# Patient Record
Sex: Female | Born: 1965
Health system: Southern US, Community
[De-identification: ages and names within clinical notes are randomized; demographics above are authoritative.]

## PROBLEM LIST (undated history)

## (undated) DIAGNOSIS — J45909 Unspecified asthma, uncomplicated: Secondary | ICD-10-CM

## (undated) HISTORY — DX: Unspecified asthma, uncomplicated: J45.909

## (undated) HISTORY — PX: TUBAL LIGATION: SHX77

## (undated) HISTORY — PX: ABDOMINAL HYSTERECTOMY: SHX81

## (undated) HISTORY — PX: KNEE SURGERY: SHX244

---

## 2010-03-06 ENCOUNTER — Emergency Department (HOSPITAL_BASED_OUTPATIENT_CLINIC_OR_DEPARTMENT_OTHER): Admission: EM | Admit: 2010-03-06 | Discharge: 2010-03-06 | Payer: Self-pay | Admitting: Emergency Medicine

## 2010-03-10 ENCOUNTER — Emergency Department (HOSPITAL_BASED_OUTPATIENT_CLINIC_OR_DEPARTMENT_OTHER): Admission: EM | Admit: 2010-03-10 | Discharge: 2010-03-10 | Payer: Self-pay | Admitting: Emergency Medicine

## 2010-04-12 ENCOUNTER — Emergency Department (HOSPITAL_BASED_OUTPATIENT_CLINIC_OR_DEPARTMENT_OTHER)
Admission: EM | Admit: 2010-04-12 | Discharge: 2010-04-12 | Payer: Self-pay | Source: Home / Self Care | Admitting: Emergency Medicine

## 2010-07-17 LAB — WET PREP, GENITAL: Yeast Wet Prep HPF POC: NONE SEEN

## 2010-07-17 LAB — GC/CHLAMYDIA PROBE AMP, GENITAL: GC Probe Amp, Genital: NEGATIVE

## 2010-07-17 LAB — CULTURE, ROUTINE-ABSCESS

## 2010-11-04 ENCOUNTER — Emergency Department (HOSPITAL_BASED_OUTPATIENT_CLINIC_OR_DEPARTMENT_OTHER)
Admission: EM | Admit: 2010-11-04 | Discharge: 2010-11-04 | Disposition: A | Discharge status: Home / Self Care | Payer: Self-pay | Attending: Emergency Medicine | Admitting: Emergency Medicine

## 2010-11-04 ENCOUNTER — Emergency Department (INDEPENDENT_AMBULATORY_CARE_PROVIDER_SITE_OTHER): Payer: Self-pay

## 2010-11-04 DIAGNOSIS — R0789 Other chest pain: Secondary | ICD-10-CM | POA: Insufficient documentation

## 2010-11-04 DIAGNOSIS — R05 Cough: Secondary | ICD-10-CM | POA: Insufficient documentation

## 2010-11-04 DIAGNOSIS — R079 Chest pain, unspecified: Secondary | ICD-10-CM

## 2010-11-04 DIAGNOSIS — F17200 Nicotine dependence, unspecified, uncomplicated: Secondary | ICD-10-CM

## 2010-11-04 DIAGNOSIS — R0602 Shortness of breath: Secondary | ICD-10-CM

## 2010-11-04 DIAGNOSIS — R059 Cough, unspecified: Secondary | ICD-10-CM | POA: Insufficient documentation

## 2010-11-04 DIAGNOSIS — R071 Chest pain on breathing: Secondary | ICD-10-CM | POA: Insufficient documentation

## 2011-09-28 ENCOUNTER — Emergency Department (HOSPITAL_BASED_OUTPATIENT_CLINIC_OR_DEPARTMENT_OTHER)
Admission: EM | Admit: 2011-09-28 | Discharge: 2011-09-28 | Disposition: A | Discharge status: Home / Self Care | Payer: Self-pay | Attending: Emergency Medicine | Admitting: Emergency Medicine

## 2011-09-28 ENCOUNTER — Encounter (HOSPITAL_BASED_OUTPATIENT_CLINIC_OR_DEPARTMENT_OTHER): Payer: Self-pay | Admitting: *Deleted

## 2011-09-28 DIAGNOSIS — F17200 Nicotine dependence, unspecified, uncomplicated: Secondary | ICD-10-CM | POA: Insufficient documentation

## 2011-09-28 DIAGNOSIS — H00019 Hordeolum externum unspecified eye, unspecified eyelid: Secondary | ICD-10-CM | POA: Insufficient documentation

## 2011-09-28 MED ORDER — FLUORESCEIN SODIUM 1 MG OP STRP
ORAL_STRIP | OPHTHALMIC | Status: AC
  Filled 2011-09-28: qty 1

## 2011-09-28 MED ORDER — DOXYCYCLINE MONOHYDRATE 100 MG PO TABS: 100.0000 mg | ORAL_TABLET | Freq: Two times a day (BID) | ORAL | Status: AC

## 2011-09-28 MED ORDER — TETRACAINE HCL 0.5 % OP SOLN
OPHTHALMIC | Status: AC
  Filled 2011-09-28: qty 2

## 2011-09-28 MED ORDER — DOXYCYCLINE HYCLATE 100 MG PO CAPS: 100.0000 mg | ORAL_CAPSULE | Freq: Two times a day (BID) | ORAL | Status: AC

## 2011-09-28 NOTE — Discharge Instructions (Signed)
Sty  A sty (hordeolum) is an infection of a gland in the eyelid located at the base of the eyelash. A sty may develop a white or yellow head of pus. It can be puffy (swollen). Usually, the sty will burst and pus will come out on its own. They do not leave lumps in the eyelid once they drain.  A sty is often confused with another form of cyst of the eyelid called a chalazion. Chalazions occur within the eyelid and not on the edge where the bases of the eyelashes are. They often are red, sore and then form firm lumps in the eyelid.  CAUSES    Germs (bacteria).   Lasting (chronic) eyelid inflammation.  SYMPTOMS    Tenderness, redness and swelling along the edge of the eyelid at the base of the eyelashes.   Sometimes, there is a white or yellow head of pus. It may or may not drain.  DIAGNOSIS   An ophthalmologist will be able to distinguish between a sty and a chalazion and treat the condition appropriately.   TREATMENT    Styes are typically treated with warm packs (compresses) until drainage occurs.   In rare cases, medicines that kill germs (antibiotics) may be prescribed. These antibiotics may be in the form of drops, cream or pills.   If a hard lump has formed, it is generally necessary to do a small incision and remove the hardened contents of the cyst in a minor surgical procedure done in the office.   In suspicious cases, your caregiver may send the contents of the cyst to the lab to be certain that it is not a rare, but dangerous form of cancer of the glands of the eyelid.  HOME CARE INSTRUCTIONS    Wash your hands often and dry them with a clean towel. Avoid touching your eyelid. This may spread the infection to other parts of the eye.   Apply heat to your eyelid for 10 to 20 minutes, several times a day, to ease pain and help to heal it faster.   Do not squeeze the sty. Allow it to drain on its own. Wash your eyelid carefully 3 to 4 times per day to remove any pus.  SEEK IMMEDIATE MEDICAL CARE IF:     Your eye becomes painful or puffy (swollen).   Your vision changes.   Your sty does not drain by itself within 3 days.   Your sty comes back within a short period of time, even with treatment.   You have redness (inflammation) around the eye.   You have a fever.  Document Released: 01/30/2005 Document Revised: 04/11/2011 Document Reviewed: 10/04/2008  [employer] Patient Information 2012 [employer].

## 2011-09-28 NOTE — ED Notes (Signed)
Patient reports irritation around R eye, used warms compress and cleared up, two days ago, R eye started swelling again, used warm compress but swelling has grown worse, hurts to touch

## 2011-09-28 NOTE — ED Provider Notes (Signed)
History     CSN: [id number]  Arrival date & time 09/28/11  1151   First MD Initiated Contact with Patient 09/28/11 1235      Chief Complaint  Patient presents with  . Eye Pain    (Consider location/radiation/quality/duration/timing/severity/associated sxs/prior treatment) HPI  Patient reports that she had stye on right eye a week ago. She states that the symptoms improved but then worsened after using some eye makeup. She states that for 2 days she has had increased swelling and pain of the right upper eyelid. She denies any involvement of the eye itself. Her vision has been normal. She has had some discharge on her lids on awakening in the morning. She has no surrounding redness, fever, or chills. There has been no new trauma to the eye. She has no foreign body sensation to the eye.  History reviewed. No pertinent past medical history.  Past Surgical History  Procedure Date  . Knee surgery     bilateral  . Tubal ligation     No family history on file.  History  Substance Use Topics  . Smoking status: Current Everyday Smoker  . Smokeless tobacco: Not on file  . Alcohol Use: Yes    OB History    Grav Para Term Preterm Abortions TAB SAB Ect Mult Living                  Review of Systems  All other systems reviewed and are negative.    Allergies  Review of patient's allergies indicates no known allergies.  Home Medications  No current outpatient prescriptions on file.  BP 152/86  Pulse 61  Temp(Src) 97.9 F (36.6 C) (Oral)  Resp 16  Ht 5\' 4"  (1.626 m)  Wt 155 lb (70.308 kg)  BMI 26.61 kg/m2  SpO2 99%  Physical Exam  Nursing note and vitals reviewed. Constitutional: She is oriented to person, place, and time. She appears well-developed and well-nourished.  HENT:  Head: Normocephalic and atraumatic.  Right Ear: External ear normal.  Left Ear: External ear normal.  Nose: Nose normal.  Mouth/Throat: Oropharynx is clear and moist.  Eyes: Conjunctivae  and EOM are normal. Pupils are equal, round, and reactive to light. Right eye exhibits hordeolum. Right eye exhibits no chemosis, no discharge and no exudate. No foreign body present in the right eye. Left eye exhibits no chemosis, no discharge, no exudate and no hordeolum. No foreign body present in the left eye.         Upper lid with some surrounding erythema  Neck: Normal range of motion. Neck supple.  Neurological: She is alert and oriented to person, place, and time.  Skin: Skin is warm and dry.  Psychiatric: She has a normal mood and affect.    ED Course  Procedures (including critical care time)  Labs Reviewed - No data to display No results found.   No diagnosis found.    MDM  Patient with hordeolum right eyelid.  Plan warm compresses and given surrounding erythema will treat with oral antibiotics for staph coverage.  Patient advised regarding cleaning.       Hilario Quarry, MD 09/28/11 1248

## 2011-11-27 ENCOUNTER — Emergency Department (HOSPITAL_BASED_OUTPATIENT_CLINIC_OR_DEPARTMENT_OTHER)
Admission: EM | Admit: 2011-11-27 | Discharge: 2011-11-27 | Disposition: A | Discharge status: Home / Self Care | Payer: Self-pay | Attending: Emergency Medicine | Admitting: Emergency Medicine

## 2011-11-27 ENCOUNTER — Encounter (HOSPITAL_BASED_OUTPATIENT_CLINIC_OR_DEPARTMENT_OTHER): Payer: Self-pay | Admitting: *Deleted

## 2011-11-27 DIAGNOSIS — F17200 Nicotine dependence, unspecified, uncomplicated: Secondary | ICD-10-CM | POA: Insufficient documentation

## 2011-11-27 DIAGNOSIS — K047 Periapical abscess without sinus: Secondary | ICD-10-CM | POA: Insufficient documentation

## 2011-11-27 DIAGNOSIS — K029 Dental caries, unspecified: Secondary | ICD-10-CM | POA: Insufficient documentation

## 2011-11-27 MED ORDER — OXYCODONE-ACETAMINOPHEN 7.5-325 MG PO TABS: 1.0000 | ORAL_TABLET | ORAL | Status: AC | PRN

## 2011-11-27 MED ORDER — PENICILLIN V POTASSIUM 500 MG PO TABS: 500.0000 mg | ORAL_TABLET | Freq: Four times a day (QID) | ORAL | Status: AC

## 2011-11-27 MED ORDER — IBUPROFEN 600 MG PO TABS: 600.0000 mg | ORAL_TABLET | Freq: Four times a day (QID) | ORAL | Status: AC | PRN

## 2011-11-27 NOTE — ED Provider Notes (Signed)
History     CSN: [id number]  Arrival date & time 11/27/11  1758   First MD Initiated Contact with Patient 11/27/11 1809      Chief Complaint  Patient presents with  . Dental Pain     HPI Patient has known dental caries and has had swelling and pain in the left upper molars the last 4 days.  Patient denies fever.  Patient has no dentist because of lack of insurance. History reviewed. No pertinent past medical history.  Past Surgical History  Procedure Date  . Knee surgery     bilateral  . Tubal ligation     History reviewed. No pertinent family history.  History  Substance Use Topics  . Smoking status: Current Everyday Smoker  . Smokeless tobacco: Not on file  . Alcohol Use: Yes    OB History    Grav Para Term Preterm Abortions TAB SAB Ect Mult Living                  Review of Systems  All other systems reviewed and are negative.    Allergies  Review of patient's allergies indicates no known allergies.  Home Medications  No current outpatient prescriptions on file.  BP 130/70  Pulse 79  Temp 98.8 F (37.1 C)  Resp 16  Ht 5\' 3"  (1.6 m)  Wt 168 lb (76.204 kg)  BMI 29.76 kg/m2  SpO2 100%  LMP 11/20/2011  Physical Exam  Nursing note and vitals reviewed. Constitutional: She is oriented to person, place, and time. She appears well-developed. No distress.  HENT:  Head: Normocephalic and atraumatic.  Mouth/Throat:    Eyes: Pupils are equal, round, and reactive to light.  Neck: Normal range of motion.  Cardiovascular: Normal rate and intact distal pulses.   Pulmonary/Chest: No respiratory distress.  Abdominal: Normal appearance. She exhibits no distension.  Musculoskeletal: Normal range of motion.  Neurological: She is alert and oriented to person, place, and time. No cranial nerve deficit.  Skin: Skin is warm and dry. No rash noted.  Psychiatric: She has a normal mood and affect. Her behavior is normal.    ED Course  Procedures (including  critical care time)  Labs Reviewed - No data to display No results found.   1. Tooth abscess   2. Dental caries       MDM   I suspect a periapical abscess is forming.  Plan is to start on antibiotics and pain control patient Edwin Dada to followup with the dentist oral surgeon.      Nelia Shi, MD 11/27/11 1816

## 2011-11-27 NOTE — ED Notes (Signed)
Pt c/o toothache x 4 days.   

## 2012-09-09 ENCOUNTER — Encounter (HOSPITAL_BASED_OUTPATIENT_CLINIC_OR_DEPARTMENT_OTHER): Payer: Self-pay | Admitting: Family Medicine

## 2012-09-09 ENCOUNTER — Emergency Department (HOSPITAL_BASED_OUTPATIENT_CLINIC_OR_DEPARTMENT_OTHER)
Admission: EM | Admit: 2012-09-09 | Discharge: 2012-09-09 | Disposition: A | Discharge status: Home / Self Care | Payer: Self-pay | Attending: Emergency Medicine | Admitting: Emergency Medicine

## 2012-09-09 DIAGNOSIS — H109 Unspecified conjunctivitis: Secondary | ICD-10-CM | POA: Insufficient documentation

## 2012-09-09 DIAGNOSIS — Z79899 Other long term (current) drug therapy: Secondary | ICD-10-CM | POA: Insufficient documentation

## 2012-09-09 DIAGNOSIS — H5789 Other specified disorders of eye and adnexa: Secondary | ICD-10-CM | POA: Insufficient documentation

## 2012-09-09 DIAGNOSIS — F17200 Nicotine dependence, unspecified, uncomplicated: Secondary | ICD-10-CM | POA: Insufficient documentation

## 2012-09-09 MED ORDER — SULFAMETHOXAZOLE-TRIMETHOPRIM 800-160 MG PO TABS: 1.0000 | ORAL_TABLET | Freq: Two times a day (BID) | ORAL | Status: DC

## 2012-09-09 MED ORDER — POLYMYXIN B-TRIMETHOPRIM 10000-0.1 UNIT/ML-% OP SOLN: 1.0000 [drp] | OPHTHALMIC | Status: DC

## 2012-09-09 MED ORDER — FLUORESCEIN SODIUM 1 MG OP STRP
1.0000 | ORAL_STRIP | Freq: Once | OPHTHALMIC | Status: AC
  Administered 2012-09-09: 1 via OPHTHALMIC
  Filled 2012-09-09: qty 1

## 2012-09-09 NOTE — ED Provider Notes (Addendum)
History     CSN: [id number]  Arrival date & time 09/09/12  1041   First MD Initiated Contact with Patient 09/09/12 1100      Chief Complaint  Patient presents with  . Eye Problem    (Consider location/radiation/quality/duration/timing/severity/associated sxs/prior treatment) HPI Comments: Patient presents to the ER for evaluation of irritation and draining from the left eye. Patient reports that initially the area was itchy and swollen, approximately 2 days ago. This started after working outside in the yard. She does have a history of allergies. Last night the swelling got worse this morning when she woke up there was redness and there was some matting of the eye. She has not had any eye pain or vision change. She denies injury.  Patient is a 47 y.o. female presenting with eye problem.  Eye Problem Associated symptoms: discharge and itching   Associated symptoms: no photophobia     History reviewed. No pertinent past medical history.  Past Surgical History  Procedure Laterality Date  . Knee surgery      bilateral  . Tubal ligation      No family history on file.  History  Substance Use Topics  . Smoking status: Current Every Day Smoker  . Smokeless tobacco: Not on file  . Alcohol Use: Yes    OB History   Grav Para Term Preterm Abortions TAB SAB Ect Mult Living                  Review of Systems  Eyes: Positive for discharge and itching. Negative for photophobia, pain and visual disturbance.    Allergies  Review of patient's allergies indicates no known allergies.  Home Medications   Current Outpatient Rx  Name  Route  Sig  Dispense  Refill  . albuterol (PROVENTIL HFA;VENTOLIN HFA) 108 (90 BASE) MCG/ACT inhaler   Inhalation   Inhale 2 puffs into the lungs every 6 (six) hours as needed. For shortness of breath         . ibuprofen (ADVIL,MOTRIN) 200 MG tablet   Oral   Take 600 mg by mouth every 6 (six) hours as needed. For teeth pain           BP  135/76  Pulse 68  Temp(Src) 98.7 F (37.1 C) (Oral)  Resp 16  Ht 5\' 3"  (1.6 m)  Wt 160 lb (72.576 kg)  BMI 28.35 kg/m2  SpO2 97%  LMP 09/07/2012  Physical Exam  Eyes: Pupils are equal, round, and reactive to light. Left eye exhibits no chemosis, no discharge and no exudate. No foreign body present in the left eye. Left conjunctiva is injected. Left conjunctiva has no hemorrhage. Left eye exhibits normal extraocular motion and no nystagmus.  Slit lamp exam:      The left eye shows no fluorescein uptake.  Edema and slight erythema of the left upper lid without any palpable nodularities or pus collections    ED Course  Procedures (including critical care time)  Labs Reviewed - No data to display No results found.   Diagnosis: Conjunctivitis, possible early chalazion    MDM  This presents to the ER with complaints of swelling and drainage from the left eye. There's conjunctivae injection. No foreign bodies are seen. Fluorescein exam does not show any ulceration, dendrites or abrasion. She does have mild erythema and swelling of the upper lip of any discrete lesion. She does report that she has had a previous infection of the eyelid, however. Patient will be treated  with Polytrim drops, Bactrim. She can take over-the-counter Benadryl as needed for swelling and itching.        Gilda Crease, MD 09/09/12 1144  Gilda Crease, MD 09/09/12 1144

## 2012-09-09 NOTE — ED Notes (Signed)
Pt c/o left eye draining and eyelid irritated and swollen x 2 days. Pt reports h/o same.

## 2012-10-01 ENCOUNTER — Encounter (HOSPITAL_BASED_OUTPATIENT_CLINIC_OR_DEPARTMENT_OTHER): Payer: Self-pay

## 2012-10-01 ENCOUNTER — Emergency Department (HOSPITAL_BASED_OUTPATIENT_CLINIC_OR_DEPARTMENT_OTHER)
Admission: EM | Admit: 2012-10-01 | Discharge: 2012-10-01 | Disposition: A | Discharge status: Home / Self Care | Payer: Self-pay | Attending: Emergency Medicine | Admitting: Emergency Medicine

## 2012-10-01 ENCOUNTER — Emergency Department (HOSPITAL_BASED_OUTPATIENT_CLINIC_OR_DEPARTMENT_OTHER): Payer: Self-pay

## 2012-10-01 DIAGNOSIS — J069 Acute upper respiratory infection, unspecified: Secondary | ICD-10-CM | POA: Insufficient documentation

## 2012-10-01 DIAGNOSIS — R062 Wheezing: Secondary | ICD-10-CM | POA: Insufficient documentation

## 2012-10-01 DIAGNOSIS — R059 Cough, unspecified: Secondary | ICD-10-CM | POA: Insufficient documentation

## 2012-10-01 DIAGNOSIS — IMO0001 Reserved for inherently not codable concepts without codable children: Secondary | ICD-10-CM | POA: Insufficient documentation

## 2012-10-01 DIAGNOSIS — Z87891 Personal history of nicotine dependence: Secondary | ICD-10-CM | POA: Insufficient documentation

## 2012-10-01 DIAGNOSIS — J3489 Other specified disorders of nose and nasal sinuses: Secondary | ICD-10-CM | POA: Insufficient documentation

## 2012-10-01 DIAGNOSIS — R05 Cough: Secondary | ICD-10-CM | POA: Insufficient documentation

## 2012-10-01 MED ORDER — HYDROCOD POLST-CHLORPHEN POLST 10-8 MG/5ML PO LQCR: 5.0000 mL | Freq: Two times a day (BID) | ORAL | Status: DC | PRN

## 2012-10-01 NOTE — ED Provider Notes (Signed)
History     CSN: [id number]  Arrival date & time 10/01/12  1403   First MD Initiated Contact with Patient 10/01/12 1413      Chief Complaint  Patient presents with  . URI    (Consider location/radiation/quality/duration/timing/severity/associated sxs/prior treatment) Patient is a 47 y.o. female presenting with URI. The history is provided by the patient. No language interpreter was used.  URI Presenting symptoms: congestion and cough   Severity:  Moderate Onset quality:  Gradual Duration:  2 weeks Timing:  Intermittent Progression:  Waxing and waning Chronicity:  New Relieved by:  Nothing Ineffective treatments:  OTC medications Associated symptoms: myalgias and wheezing     History reviewed. No pertinent past medical history.  Past Surgical History  Procedure Laterality Date  . Knee surgery      bilateral  . Tubal ligation      No family history on file.  History  Substance Use Topics  . Smoking status: Former Smoker    Quit date: 09/12/2012  . Smokeless tobacco: Not on file  . Alcohol Use: Yes    OB History   Grav Para Term Preterm Abortions TAB SAB Ect Mult Living                  Review of Systems  HENT: Positive for congestion.   Respiratory: Positive for cough and wheezing.   Musculoskeletal: Positive for myalgias.  All other systems reviewed and are negative.    Allergies  Review of patient's allergies indicates no known allergies.  Home Medications   Current Outpatient Rx  Name  Route  Sig  Dispense  Refill  . albuterol (PROVENTIL HFA;VENTOLIN HFA) 108 (90 BASE) MCG/ACT inhaler   Inhalation   Inhale 2 puffs into the lungs every 6 (six) hours as needed. For shortness of breath         . ibuprofen (ADVIL,MOTRIN) 200 MG tablet   Oral   Take 600 mg by mouth every 6 (six) hours as needed. For teeth pain         . sulfamethoxazole-trimethoprim (SEPTRA DS) 800-160 MG per tablet   Oral   Take 1 tablet by mouth every 12 (twelve)  hours.   10 tablet   0   . trimethoprim-polymyxin b (POLYTRIM) ophthalmic solution   Left Eye   Place 1 drop into the left eye every 4 (four) hours.   10 mL   0     BP 117/74  Pulse 66  Temp(Src) 98.8 F (37.1 C) (Oral)  Resp 20  Ht 5\' 3"  (1.6 m)  Wt 158 lb (71.668 kg)  BMI 28 kg/m2  SpO2 98%  LMP 09/07/2012  Physical Exam  Nursing note and vitals reviewed. Constitutional: She is oriented to person, place, and time. She appears well-developed and well-nourished.  HENT:  Head: Normocephalic.  Mouth/Throat: No oropharyngeal exudate.  Pharyngeal erythema  Neck: Normal range of motion.  Cardiovascular: Normal rate and regular rhythm.   Pulmonary/Chest: Effort normal.  Bilateral rhonchi  Abdominal: Soft. Bowel sounds are normal.  Musculoskeletal: Normal range of motion. She exhibits no edema and no tenderness.  Lymphadenopathy:    She has no cervical adenopathy.  Neurological: She is alert and oriented to person, place, and time.  Skin: Skin is warm and dry. No rash noted.  Psychiatric: She has a normal mood and affect. Her behavior is normal. Judgment and thought content normal.    ED Course  Procedures (including critical care time)  Labs Reviewed - No data  to display No results found.   No diagnosis found.  No acute findings noted on chest films.  Results shared with patient.  Viral URI with cough.  MDM          Jimmye Norman, NP 10/01/12 1623

## 2012-10-01 NOTE — ED Notes (Signed)
C/o head and chest congestion x 2 weeks

## 2012-10-05 NOTE — ED Provider Notes (Signed)
Medical screening examination/treatment/procedure(s) were performed by non-physician practitioner and as supervising physician I was immediately available for consultation/collaboration.  Kelcy Baeten, MD 10/05/12 1420 

## 2012-11-10 ENCOUNTER — Encounter (HOSPITAL_BASED_OUTPATIENT_CLINIC_OR_DEPARTMENT_OTHER): Payer: Self-pay | Admitting: *Deleted

## 2012-11-10 ENCOUNTER — Emergency Department (HOSPITAL_BASED_OUTPATIENT_CLINIC_OR_DEPARTMENT_OTHER): Payer: Self-pay

## 2012-11-10 ENCOUNTER — Emergency Department (HOSPITAL_BASED_OUTPATIENT_CLINIC_OR_DEPARTMENT_OTHER)
Admission: EM | Admit: 2012-11-10 | Discharge: 2012-11-10 | Disposition: A | Discharge status: Other Acute Inpatient Hospital | Payer: Self-pay | Attending: Emergency Medicine | Admitting: Emergency Medicine

## 2012-11-10 DIAGNOSIS — Z79899 Other long term (current) drug therapy: Secondary | ICD-10-CM | POA: Insufficient documentation

## 2012-11-10 DIAGNOSIS — Y929 Unspecified place or not applicable: Secondary | ICD-10-CM | POA: Insufficient documentation

## 2012-11-10 DIAGNOSIS — S61209A Unspecified open wound of unspecified finger without damage to nail, initial encounter: Secondary | ICD-10-CM | POA: Insufficient documentation

## 2012-11-10 DIAGNOSIS — L089 Local infection of the skin and subcutaneous tissue, unspecified: Secondary | ICD-10-CM

## 2012-11-10 DIAGNOSIS — Z87891 Personal history of nicotine dependence: Secondary | ICD-10-CM | POA: Insufficient documentation

## 2012-11-10 DIAGNOSIS — W19XXXA Unspecified fall, initial encounter: Secondary | ICD-10-CM | POA: Insufficient documentation

## 2012-11-10 DIAGNOSIS — Y9302 Activity, running: Secondary | ICD-10-CM | POA: Insufficient documentation

## 2012-11-10 LAB — CBC WITH DIFFERENTIAL/PLATELET
Basophils Relative: 0 % (ref 0–1)
HCT: 35.5 % — ABNORMAL LOW (ref 36.0–46.0)
Hemoglobin: 12 g/dL (ref 12.0–15.0)
Lymphs Abs: 2.5 10*3/uL (ref 0.7–4.0)
MCHC: 33.8 g/dL (ref 30.0–36.0)
Monocytes Absolute: 1.1 10*3/uL — ABNORMAL HIGH (ref 0.1–1.0)
Monocytes Relative: 7 % (ref 3–12)
Neutro Abs: 11.5 10*3/uL — ABNORMAL HIGH (ref 1.7–7.7)
RBC: 3.6 MIL/uL — ABNORMAL LOW (ref 3.87–5.11)

## 2012-11-10 LAB — BASIC METABOLIC PANEL
BUN: 9 mg/dL (ref 6–23)
CO2: 27 mEq/L (ref 19–32)
Chloride: 101 mEq/L (ref 96–112)
Creatinine, Ser: 0.7 mg/dL (ref 0.50–1.10)
GFR calc Af Amer: >90 mL/min (ref >90)
Glucose, Bld: 102 mg/dL — ABNORMAL HIGH (ref 70–99)
Potassium: 3.7 mEq/L (ref 3.5–5.1)

## 2012-11-10 MED ORDER — SODIUM CHLORIDE 0.9 % IV SOLN
INTRAVENOUS | Status: DC
  Administered 2012-11-10: 12:00:00 via INTRAVENOUS

## 2012-11-10 MED ORDER — VANCOMYCIN HCL IN DEXTROSE 1-5 GM/200ML-% IV SOLN
1000.0000 mg | Freq: Once | INTRAVENOUS | Status: DC
  Filled 2012-11-10: qty 200

## 2012-11-10 MED ORDER — MORPHINE SULFATE 4 MG/ML IJ SOLN
4.0000 mg | Freq: Once | INTRAMUSCULAR | Status: AC
  Administered 2012-11-10: 4 mg via INTRAVENOUS
  Filled 2012-11-10: qty 1

## 2012-11-10 NOTE — ED Notes (Signed)
Dr. Rubin Payor wants Pt. To keep the IV in place with saline lock.

## 2012-11-10 NOTE — ED Provider Notes (Signed)
History    CSN: [id number] Arrival date & time 11/10/12  1112  First MD Initiated Contact with Patient 11/10/12 1134     Chief Complaint  Patient presents with  . Hand Injury   (Consider location/radiation/quality/duration/timing/severity/associated sxs/prior Treatment) Patient is a 47 y.o. female presenting with hand injury. The history is provided by the patient.  Hand Injury Associated symptoms: no back pain and no fever    patient fell on July 4 and injured her left hand. She states she had had pain along the little finger with some swelling up until yesterday she states now it is much more swollen although it. There is pain with moving of the fingers. No fevers. No numbness weakness. She states she has other abrasions but no real pain. She states that she was bumped in the face also has a black eye. She states she has no headache. No difficulty seeing. She states there's been some drainage from the finger. History reviewed. No pertinent past medical history. Past Surgical History  Procedure Laterality Date  . Knee surgery      bilateral  . Tubal ligation     No family history on file. History  Substance Use Topics  . Smoking status: Former Smoker    Quit date: 09/12/2012  . Smokeless tobacco: Not on file  . Alcohol Use: Yes   OB History   Grav Para Term Preterm Abortions TAB SAB Ect Mult Living                 Review of Systems  Constitutional: Negative for fever, activity change and appetite change.  HENT: Negative for sore throat and neck stiffness.   Eyes: Negative for pain.  Respiratory: Negative for chest tightness and shortness of breath.   Cardiovascular: Negative for chest pain and leg swelling.  Gastrointestinal: Negative for nausea, vomiting, abdominal pain and diarrhea.  Genitourinary: Negative for flank pain.  Musculoskeletal: Positive for joint swelling. Negative for back pain.  Skin: Positive for color change and wound. Negative for rash.   Neurological: Negative for weakness, numbness and headaches.  Psychiatric/Behavioral: Negative for behavioral problems.    Allergies  Review of patient's allergies indicates no known allergies.  Home Medications   Current Outpatient Rx  Name  Route  Sig  Dispense  Refill  . albuterol (PROVENTIL HFA;VENTOLIN HFA) 108 (90 BASE) MCG/ACT inhaler   Inhalation   Inhale 2 puffs into the lungs every 6 (six) hours as needed. For shortness of breath         . chlorpheniramine-HYDROcodone (TUSSIONEX PENNKINETIC ER) 10-8 MG/5ML LQCR   Oral   Take 5 mLs by mouth every 12 (twelve) hours as needed.   140 mL   0   . ibuprofen (ADVIL,MOTRIN) 200 MG tablet   Oral   Take 600 mg by mouth every 6 (six) hours as needed. For teeth pain         . sulfamethoxazole-trimethoprim (SEPTRA DS) 800-160 MG per tablet   Oral   Take 1 tablet by mouth every 12 (twelve) hours.   10 tablet   0   . trimethoprim-polymyxin b (POLYTRIM) ophthalmic solution   Left Eye   Place 1 drop into the left eye every 4 (four) hours.   10 mL   0    BP 140/87  Pulse 70  Temp(Src) 98.2 F (36.8 C) (Oral)  Resp 20  Ht 5\' 3"  (1.6 m)  Wt 158 lb (71.668 kg)  BMI 28 kg/m2  SpO2 100%  LMP  11/06/2012 Physical Exam  Nursing note and vitals reviewed. Constitutional: She is oriented to person, place, and time. She appears well-developed and well-nourished.  HENT:  Head: Normocephalic and atraumatic.  Eyes: EOM are normal. Pupils are equal, round, and reactive to light.  Right periorbital ecchymosis. Extraocular movements intact. Minimal tenderness. No proptosis  Neck: Normal range of motion. Neck supple.  Cardiovascular: Normal rate, regular rhythm and normal heart sounds.   No murmur heard. Pulmonary/Chest: Effort normal and breath sounds normal. No respiratory distress. She has no wheezes. She has no rales.  Abdominal: Soft. Bowel sounds are normal. She exhibits no distension. There is no tenderness. There is no  rebound and no guarding.  Musculoskeletal: She exhibits tenderness.  Swelling of left hand diffusely. Erythema over fourth and fifth fingers at MCP joint area. Pain with movement of the little finger and ring finger. Decreased range of motion due to the swelling and pain. There is scab laterally on the proximal phalanx of the fifth finger that has purulent drainage.  Neurological: She is alert and oriented to person, place, and time. No cranial nerve deficit.  Skin: Skin is warm and dry.  Psychiatric: She has a normal mood and affect. Her speech is normal.    ED Course  Procedures (including critical care time) Labs Reviewed  CBC WITH DIFFERENTIAL - Abnormal; Notable for the following:    WBC 15.7 (*)    RBC 3.60 (*)    HCT 35.5 (*)    Neutro Abs 11.5 (*)    Monocytes Absolute 1.1 (*)    All other components within normal limits  BASIC METABOLIC PANEL - Abnormal; Notable for the following:    Glucose, Bld 102 (*)    All other components within normal limits  WOUND CULTURE   Dg Hand Complete Left  11/10/2012   *RADIOLOGY REPORT*  Clinical Data: Fall with pain and swelling.  LEFT HAND - COMPLETE 3+ VIEW  Comparison: None.  Findings: Extensive soft tissue swelling at the level of the dorsal and lateral wrist and hand, extending into the fourth and fifth digits.  No definitive fracture.  There is borderline widening of the scaphoid lunate interval, not definitively pathologic. Incidental degenerative changes at the fifth DIP.  IMPRESSION: Extensive soft tissue swelling to the dorsal and lateral wrist/hand.  No acute fracture or malalignment detected.   Original Report Authenticated By: Tiburcio Pea   1. Finger infection     MDM  Patient with fall 4 days ago. Later complicated with redness and increased pain and swelling. X-ray does not show fracture. White count is elevated and there is draining purulence. Patient cannot flex or extend the finger. May require drainage. Discussed with Dr.  Wyline Mood who has accepted the patient transfer and will see them in the [facility] ER   Juliet Rude. Rubin Payor, MD 11/10/12 1350

## 2012-11-10 NOTE — ED Notes (Signed)
Pt. Aware of plan of care 

## 2012-11-10 NOTE — ED Notes (Signed)
Running and fell 4 days ago. Injury to her left hand. Swelling noted. Bruise under her right eye.

## 2012-11-13 LAB — WOUND CULTURE

## 2015-03-03 IMAGING — CR DG CHEST 2V
2 series · 2 of 2 positions shown · non-contrast
Comparison: 11/04/2010

CLINICAL DATA: Cough.  Chest congestion.  Former smoker.

CHEST - 2 VIEW

[w chest pa]
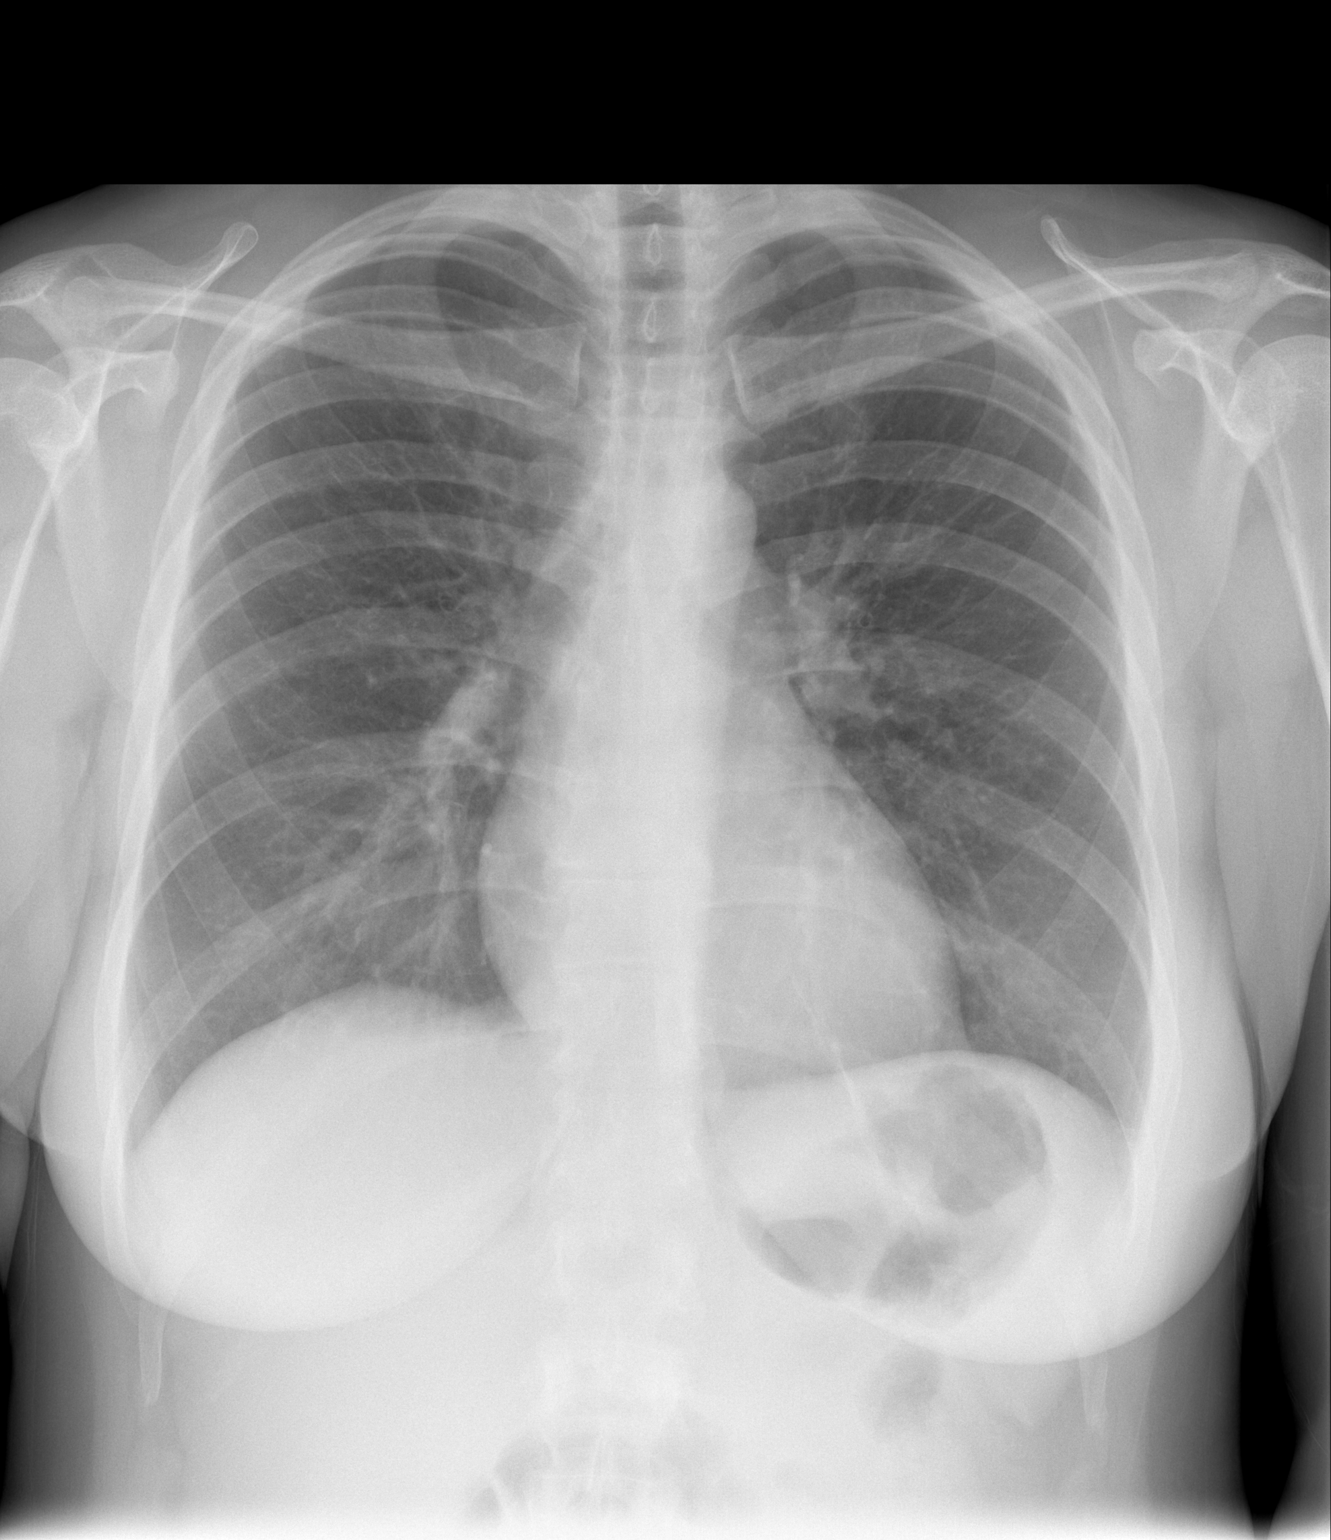

[w chest lat]
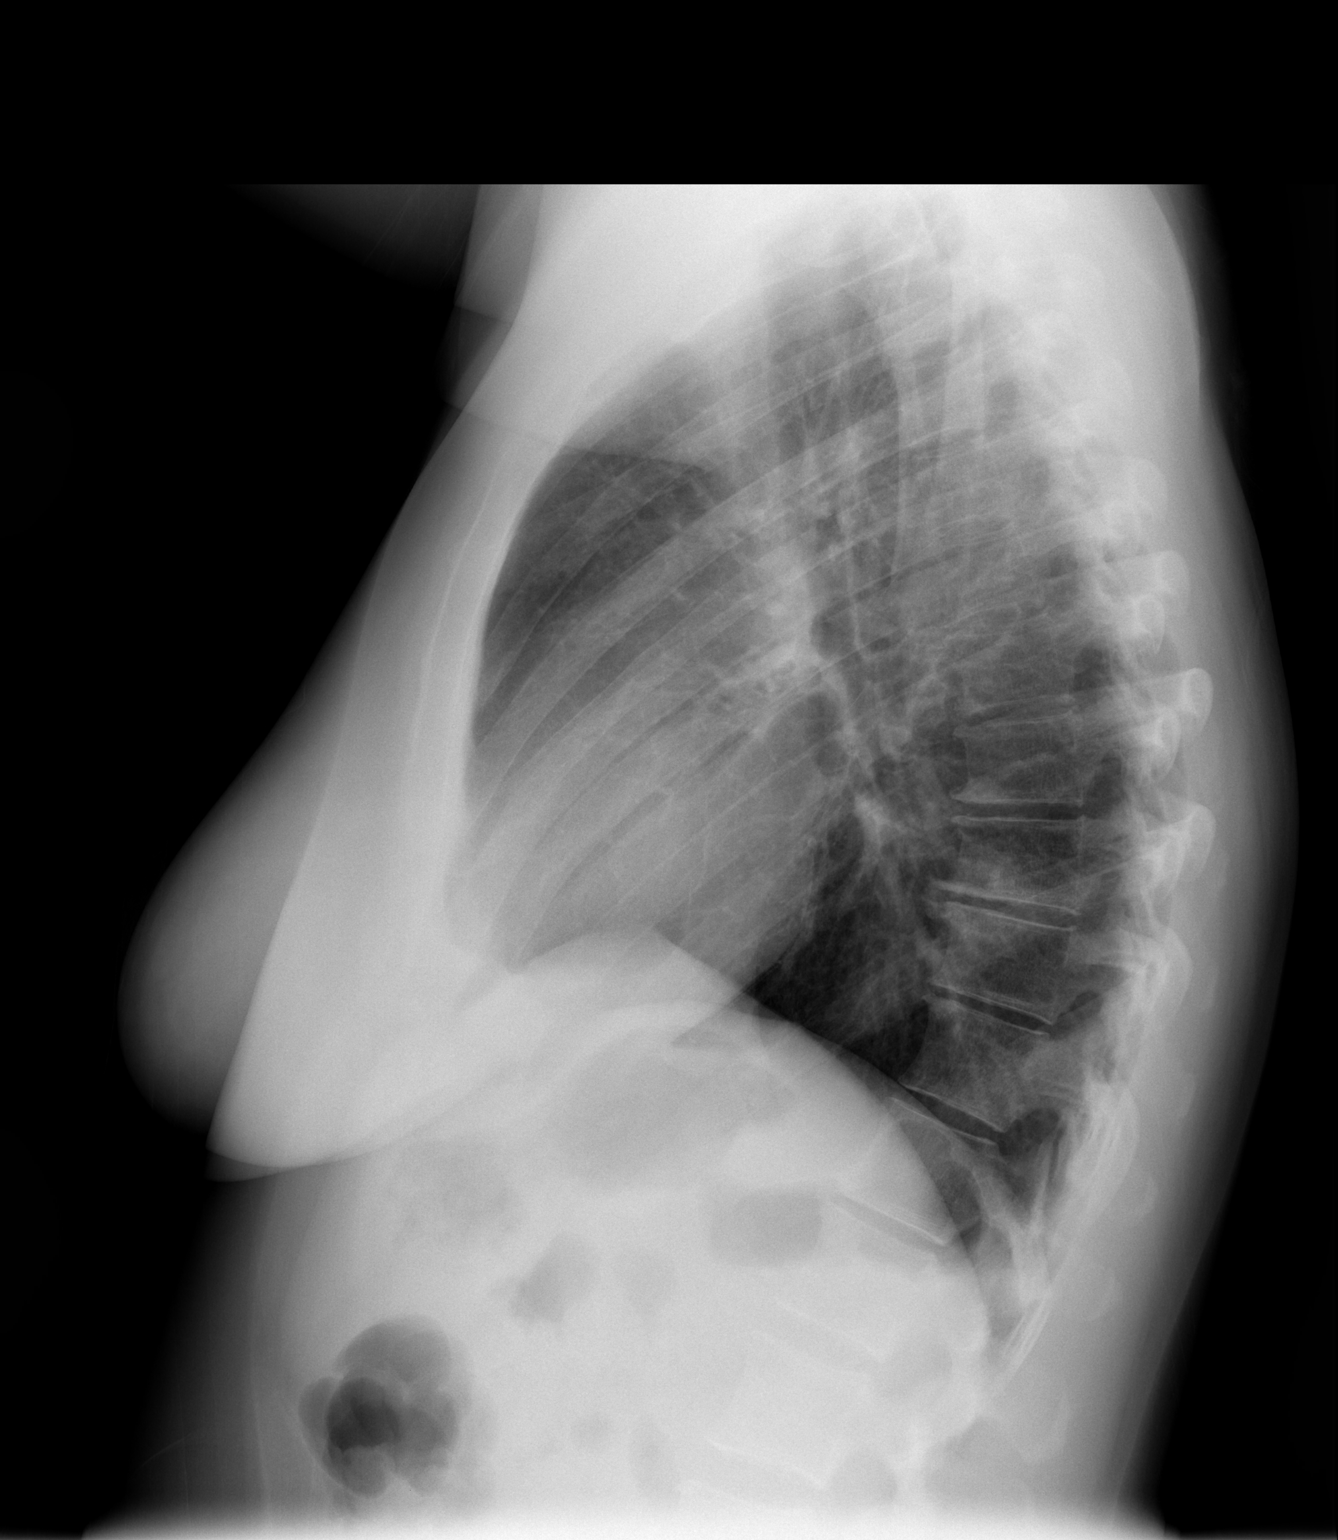

[2 of 2 positions shown; findings below may reference images not displayed]

FINDINGS: The heart size and mediastinal contours are within
normal limits.  Both lungs are clear.  The visualized skeletal
structures are unremarkable.
IMPRESSION: No active cardiopulmonary disease.

## 2015-10-18 ENCOUNTER — Ambulatory Visit (INDEPENDENT_AMBULATORY_CARE_PROVIDER_SITE_OTHER): Payer: PRIVATE HEALTH INSURANCE | Admitting: Allergy and Immunology

## 2015-10-18 ENCOUNTER — Encounter: Payer: Self-pay | Admitting: Allergy and Immunology

## 2015-10-18 VITALS — BP 130/84 | HR 64 | Temp 98.4°F | Resp 20 | Ht 63.2 in | Wt 155.6 lb

## 2015-10-18 DIAGNOSIS — J3089 Other allergic rhinitis: Secondary | ICD-10-CM | POA: Diagnosis not present

## 2015-10-18 DIAGNOSIS — J4540 Moderate persistent asthma, uncomplicated: Secondary | ICD-10-CM | POA: Insufficient documentation

## 2015-10-18 DIAGNOSIS — R05 Cough: Secondary | ICD-10-CM

## 2015-10-18 DIAGNOSIS — J45901 Unspecified asthma with (acute) exacerbation: Secondary | ICD-10-CM

## 2015-10-18 DIAGNOSIS — R059 Cough, unspecified: Secondary | ICD-10-CM

## 2015-10-18 MED ORDER — MONTELUKAST SODIUM 10 MG PO TABS: 10.0000 mg | ORAL_TABLET | Freq: Every day | ORAL | Status: DC

## 2015-10-18 MED ORDER — ALBUTEROL SULFATE HFA 108 (90 BASE) MCG/ACT IN AERS: 2.0000 | INHALATION_SPRAY | Freq: Four times a day (QID) | RESPIRATORY_TRACT | Status: DC | PRN

## 2015-10-18 MED ORDER — MOMETASONE FURO-FORMOTEROL FUM 200-5 MCG/ACT IN AERO: 2.0000 | INHALATION_SPRAY | Freq: Two times a day (BID) | RESPIRATORY_TRACT | Status: DC

## 2015-10-18 MED ORDER — PREDNISONE 1 MG PO TABS: 10.0000 mg | ORAL_TABLET | ORAL | Status: DC

## 2015-10-18 NOTE — Progress Notes (Signed)
New Patient Note  RE: Amanda Villanueva MRN: [id number] DOB: 12-25-1965 Date of Office Visit: 10/18/2015  Referring provider: No ref. provider found Primary care provider: No PCP Per Patient  Chief Complaint: Asthma; Wheezing; Cough; and Nasal Congestion   History of present illness: HPI Comments: Amanda Villanueva is a 50 y.o. female presenting today for evaluation of asthma and rhinosinusitis.  She reports that in early May 2017 she developed nasal symptoms which progressed into a sinus infection.  She believes that the sinus infection progressed into pneumonia stating that her upper respiratory tract infections "always go straight to my chest." Chest x-ray confirmed left lower lobe pneumonia. She was treated with antibiotics and her symptoms improved somewhat however she is still experiencing frequent coughing, chest tightness, and wheezing.  She has recently required albuterol multiple times per day and experiences nocturnal awakenings due to lower rest or symptoms several times per week.  She reports that she had a diagnosis of childhood asthma which resolved in her late 91s.  The asthma symptoms returned last year.  She experiences nasal congestion, postnasal drainage, and sinus pressure.  These symptoms seem to be triggered by dust and mold exposure.  She currently lives in a basement with dust and mold and her place of work is dusty as well.  Has a 5-pack-year history having quit tobacco in 2014.   Assessment and plan: Asthma with acute exacerbation  A two-view chest x-ray has been ordered for comparison with May 22 films.  Prednisone has been provided, 20 mg x 4 days, 10 mg x1 day, then stop.    A sample and prescription have been provided for [facility] (mometasone/formoterol) 200/5 g, 2 inhalations twice a day.  To maximize pulmonary deposition, a spacer has been provided along with instructions for its proper administration with an HFA inhaler. A prescription has been provided for  montelukast 10 mg daily at bedtime.  Continue albuterol every HFA, 1-2 inhalations every 4-6 hours as needed.  The patient has been asked to contact me if her symptoms persist or progress. Otherwise, she may return for follow up in 6 weeks.  Seasonal and perennial allergic rhinitis  Aeroallergen avoidance measures have been discussed and provided in written form.  A prescription has been provided for fluticasone nasal spray, 2 sprays per nostril daily as needed. Proper nasal spray technique has been discussed and demonstrated.  I have also recommended nasal saline spray (i.e., Simply Saline) or nasal saline lavage (i.e., NeilMed) as needed prior to medicated nasal sprays.  A prescription has been provided for montelukast (as above).  If allergen avoidance measures and medications fail to adequately relieve symptoms, aeroallergen immunotherapy will be considered.    Meds ordered this encounter  Medications  . mometasone-formoterol (DULERA) 200-5 MCG/ACT AERO    Sig: Inhale 2 puffs into the lungs 2 (two) times daily. USE SPACER.    Dispense:  1 Inhaler    Refill:  5  . montelukast (SINGULAIR) 10 MG tablet    Sig: Take 1 tablet (10 mg total) by mouth at bedtime.    Dispense:  30 tablet    Refill:  5  . albuterol (PROVENTIL HFA;VENTOLIN HFA) 108 (90 Base) MCG/ACT inhaler    Sig: Inhale 2 puffs into the lungs every 6 (six) hours as needed (USE SPACER). For shortness of breath    Dispense:  8 g    Refill:  2  . predniSONE (DELTASONE) tablet 10 mg    Sig:     Diagnositics: Spirometry:  FVC was 3.13 L and FEV1 was 2.77 L with 160 mL (6%) postbronchodilator improvement. Allergy skin testing: Positive to grass pollens and molds.    Physical examination: Blood pressure 130/84, pulse 64, temperature 98.4 F (36.9 C), temperature source Oral, resp. rate 20, height 5' 3.2" (1.605 m), weight 155 lb 9.6 oz (70.58 kg).  General: Alert, interactive, in no acute distress. HEENT: TMs  pearly gray, turbinates edematous without discharge, post-pharynx moderately erythematous. Neck: Supple without lymphadenopathy. Lungs: Clear to auscultation without wheezing, rhonchi or rales. CV: Normal S1, S2 without murmurs. Abdomen: Nondistended, nontender. Skin: Warm and dry, without lesions or rashes. Extremities:  No clubbing, cyanosis or edema. Neuro:   Grossly intact.  Review of systems:  Review of Systems  Constitutional: Negative for fever, chills and weight loss.  HENT: Positive for congestion. Negative for nosebleeds.   Eyes: Negative for blurred vision.  Respiratory: Positive for cough, sputum production, shortness of breath and wheezing. Negative for hemoptysis.   Cardiovascular: Negative for chest pain.  Gastrointestinal: Negative for diarrhea and constipation.  Genitourinary: Negative for dysuria.  Musculoskeletal: Negative for myalgias and joint pain.  Skin: Negative for itching and rash.  Neurological: Positive for headaches. Negative for dizziness.  Endo/Heme/Allergies: Positive for environmental allergies. Does not bruise/bleed easily.    Past medical history:  Past Medical History  Diagnosis Date  . Asthma     Past surgical history:  Past Surgical History  Procedure Laterality Date  . Knee surgery      bilateral  . Tubal ligation      Family history: Family History  Problem Relation Age of Onset  . Stroke Mother   . Allergic rhinitis Father   . Pneumonia Daughter     Social history: Social History   Social History  . Marital Status: Single    Spouse Name: N/A  . Number of Children: N/A  . Years of Education: N/A   Occupational History  . Not on file.   Social History Main Topics  . Smoking status: Former Smoker    Quit date: 09/12/2012  . Smokeless tobacco: Never Used  . Alcohol Use: Yes  . Drug Use: Yes    Special: Marijuana  . Sexual Activity: Yes    Birth Control/ Protection: Surgical   Other Topics Concern  . Not on file     Social History Narrative   Environmental History: Patient lives in a 49 year old house with hardwood floors throughout, gas heat, and window air conditioning units.  She has no pets in the home and is a former cigarette smoker having quit in May 2014.    Medication List       This list is accurate as of: 10/18/15  6:14 PM.  Always use your most recent med list.               albuterol 108 (90 Base) MCG/ACT inhaler  Commonly known as:  PROVENTIL HFA;VENTOLIN HFA  Inhale 2 puffs into the lungs every 6 (six) hours as needed (USE SPACER). For shortness of breath     ibuprofen 200 MG tablet  Commonly known as:  ADVIL,MOTRIN  Take 600 mg by mouth every 6 (six) hours as needed. For teeth pain     mometasone-formoterol 200-5 MCG/ACT Aero  Commonly known as:  DULERA  Inhale 2 puffs into the lungs 2 (two) times daily. USE SPACER.     montelukast 10 MG tablet  Commonly known as:  SINGULAIR  Take 1 tablet (10 mg total) by mouth  at bedtime.        Known medication allergies: No Known Allergies  I appreciate the opportunity to take part in this Amanda Villanueva's care. Please do not hesitate to contact me with questions.  Sincerely,   R. Edgar Frisk, MD

## 2015-10-18 NOTE — Patient Instructions (Addendum)
Asthma with acute exacerbation  A two-view chest x-ray has been ordered for comparison with May 22 films.  Prednisone has been provided, 20 mg x 4 days, 10 mg x1 day, then stop.    A sample and prescription have been provided for [facility] (mometasone/formoterol) 200/5 g, 2 inhalations twice a day.  To maximize pulmonary deposition, a spacer has been provided along with instructions for its proper administration with an HFA inhaler. A prescription has been provided for montelukast 10 mg daily at bedtime.  Continue albuterol every HFA, 1-2 inhalations every 4-6 hours as needed.  The patient has been asked to contact me if her symptoms persist or progress. Otherwise, she may return for follow up in 6 weeks.  Seasonal and perennial allergic rhinitis  Aeroallergen avoidance measures have been discussed and provided in written form.  A prescription has been provided for fluticasone nasal spray, 2 sprays per nostril daily as needed. Proper nasal spray technique has been discussed and demonstrated.  I have also recommended nasal saline spray (i.e., Simply Saline) or nasal saline lavage (i.e., NeilMed) as needed prior to medicated nasal sprays.  A prescription has been provided for montelukast (as above).  If allergen avoidance measures and medications fail to adequately relieve symptoms, aeroallergen immunotherapy will be considered.    Return in about 6 weeks (around 11/29/2015), or if symptoms worsen or fail to improve.  Reducing Pollen Exposure  The American Academy of Allergy, Asthma and Immunology suggests the following steps to reduce your exposure to pollen during allergy seasons.    1. Do not hang sheets or clothing out to dry; pollen may collect on these items. 2. Do not mow lawns or spend time around freshly cut grass; mowing stirs up pollen. 3. Keep windows closed at night.  Keep car windows closed while driving. 4. Minimize morning activities outdoors, a time when pollen  counts are usually at their highest. 5. Stay indoors as much as possible when pollen counts or humidity is high and on windy days when pollen tends to remain in the air longer. 6. Use air conditioning when possible.  Many air conditioners have filters that trap the pollen spores. 7. Use a HEPA room air filter to remove pollen form the indoor air you breathe.   Control of Mold Allergen  Mold and fungi can grow on a variety of surfaces provided certain temperature and moisture conditions exist.  Outdoor molds grow on plants, decaying vegetation and soil.  The major outdoor mold, Alternaria and Cladosporium, are found in very high numbers during hot and dry conditions.  Generally, a late Summer - Fall peak is seen for common outdoor fungal spores.  Rain will temporarily lower outdoor mold spore count, but counts rise rapidly when the rainy period ends.  The most important indoor molds are Aspergillus and Penicillium.  Dark, humid and poorly ventilated basements are ideal sites for mold growth.  The next most common sites of mold growth are the bathroom and the kitchen.  Outdoor [employer] 2. Use air conditioning and keep windows closed 3. Avoid exposure to decaying vegetation. 4. Avoid leaf raking. 5. Avoid grain handling. 6. Consider wearing a face mask if working in moldy areas.  Indoor Mold Control 1. Maintain humidity below 50%. 2. Clean washable surfaces with 5% bleach solution. 3. Remove sources e.g. Contaminated carpets.

## 2015-10-18 NOTE — Assessment & Plan Note (Addendum)
   A two-view chest x-ray has been ordered for comparison with May 22 films.  Prednisone has been provided, 20 mg x 4 days, 10 mg x1 day, then stop.    A sample and prescription have been provided for [facility] (mometasone/formoterol) 200/5 g, 2 inhalations twice a day.  To maximize pulmonary deposition, a spacer has been provided along with instructions for its proper administration with an HFA inhaler. A prescription has been provided for montelukast 10 mg daily at bedtime.  Continue albuterol every HFA, 1-2 inhalations every 4-6 hours as needed.  The patient has been asked to contact me if her symptoms persist or progress. Otherwise, she may return for follow up in 6 weeks.

## 2015-10-18 NOTE — Assessment & Plan Note (Addendum)
   Aeroallergen avoidance measures have been discussed and provided in written form.  A prescription has been provided for fluticasone nasal spray, 2 sprays per nostril daily as needed. Proper nasal spray technique has been discussed and demonstrated.  I have also recommended nasal saline spray (i.e., Simply Saline) or nasal saline lavage (i.e., NeilMed) as needed prior to medicated nasal sprays.  A prescription has been provided for montelukast (as above).  If allergen avoidance measures and medications fail to adequately relieve symptoms, aeroallergen immunotherapy will be considered.

## 2015-10-19 ENCOUNTER — Encounter: Payer: Self-pay | Admitting: Allergy and Immunology

## 2015-10-23 ENCOUNTER — Telehealth: Payer: Self-pay

## 2015-10-23 NOTE — Telephone Encounter (Signed)
SPOKE WITH PATIENT ON 10/19/15.  GAVE INFO ON CHEST XRAY PER DR BARDELAS. PT HAD PERFORMED AT [place].  TOLD PATIENT CXR NORMAL PER DR BARDELAS. FORWARDED RESULTS TO DR BOBBITT.

## 2015-10-23 NOTE — Telephone Encounter (Signed)
I was not able to pull up the results/interpretation.

## 2015-11-30 ENCOUNTER — Encounter: Payer: Self-pay | Admitting: Allergy and Immunology

## 2015-11-30 ENCOUNTER — Ambulatory Visit (INDEPENDENT_AMBULATORY_CARE_PROVIDER_SITE_OTHER): Payer: PRIVATE HEALTH INSURANCE | Admitting: Allergy and Immunology

## 2015-11-30 ENCOUNTER — Encounter (INDEPENDENT_AMBULATORY_CARE_PROVIDER_SITE_OTHER): Payer: Self-pay

## 2015-11-30 VITALS — BP 124/60 | HR 78 | Temp 98.8°F

## 2015-11-30 DIAGNOSIS — H1045 Other chronic allergic conjunctivitis: Secondary | ICD-10-CM | POA: Diagnosis not present

## 2015-11-30 DIAGNOSIS — J4540 Moderate persistent asthma, uncomplicated: Secondary | ICD-10-CM

## 2015-11-30 DIAGNOSIS — J3089 Other allergic rhinitis: Secondary | ICD-10-CM | POA: Diagnosis not present

## 2015-11-30 DIAGNOSIS — H1010 Acute atopic conjunctivitis, unspecified eye: Secondary | ICD-10-CM | POA: Insufficient documentation

## 2015-11-30 MED ORDER — MOMETASONE FURO-FORMOTEROL FUM 100-5 MCG/ACT IN AERO: 2.0000 | INHALATION_SPRAY | Freq: Two times a day (BID) | RESPIRATORY_TRACT | 5 refills | Status: DC

## 2015-11-30 MED ORDER — OLOPATADINE HCL 0.7 % OP SOLN: 1.0000 [drp] | Freq: Every day | OPHTHALMIC | 5 refills | Status: DC | PRN

## 2015-11-30 NOTE — Assessment & Plan Note (Signed)
Well-controlled, we will stepdown therapy at this time.  A sample and prescription have been provided for [facility] 100/5 g, 2 inhalations via spacer device twice a day.   If lower respiratory symptoms progress in frequency and/or severity, the patient is to resume the previous dose.  For now, continue montelukast 10 mg daily at bedtime and albuterol HFA, 1-2 inhalations every 4-6 hours as needed.  Subjective and objective measures of pulmonary function will be followed and the treatment plan will be adjusted accordingly.

## 2015-11-30 NOTE — Patient Instructions (Addendum)
Moderate persistent asthma Well-controlled, we will stepdown therapy at this time.  A sample and prescription have been provided for [facility] 100/5 g, 2 inhalations via spacer device twice a day.   If lower respiratory symptoms progress in frequency and/or severity, the patient is to resume the previous dose.  For now, continue montelukast 10 mg daily at bedtime and albuterol HFA, 1-2 inhalations every 4-6 hours as needed.  Subjective and objective measures of pulmonary function will be followed and the treatment plan will be adjusted accordingly.  Seasonal and perennial allergic rhinitis  For now, continue appropriate allergen avoidance measures and montelukast 10 mg daily bedtime.  For thick postnasal drainage, guaifenesin 1200 mg (Mucinex maximum strength) twice daily as needed with adequate hydration as discussed.   Nasal saline lavage (NeilMed) as needed has been recommended along with instructions for proper administration.  Seasonal allergic conjunctivitis  Treatment plan as outlined above for allergic rhinitis.  A prescription has been provided for Pazeo, one drop per eye daily as needed.   Return in about 4 months (around 04/01/2016), or if symptoms worsen or fail to improve.

## 2015-11-30 NOTE — Assessment & Plan Note (Addendum)
   For now, continue appropriate allergen avoidance measures and montelukast 10 mg daily bedtime.  For thick postnasal drainage, guaifenesin 1200 mg (Mucinex maximum strength) twice daily as needed with adequate hydration as discussed.   Nasal saline lavage (NeilMed) as needed has been recommended along with instructions for proper administration.

## 2015-11-30 NOTE — Progress Notes (Signed)
Follow-up Note  RE: Amanda Villanueva MRN: [id number] DOB: October 24, 1965 Date of Office Visit: 11/30/2015  Primary care provider: No PCP Per Patient Referring provider: No ref. provider found  History of present illness: Amanda Villanueva is a 50 y.o. female with persistent asthma and allergic nidus presenting today for follow up.  She was last seen in this clinic on 10/18/2015.  She reports that in the interval since her previous visit her asthma has been well-controlled with Dulera 200/5 g, 2 inhalations via spacer device twice a day, and montelukast 10 mg daily at bedtime.  Recently, her lower respiratory symptoms have only been triggered occasionally by hot/humid weather.  She complains of ocular pruritus when around freshman grass.  She has no nasal symptom complaints today with the exception of occasional thick postnasal drainage leading to raspy voice.    Assessment and plan: Moderate persistent asthma Well-controlled, we will stepdown therapy at this time.  A sample and prescription have been provided for [facility] 100/5 g, 2 inhalations via spacer device twice a day.   If lower respiratory symptoms progress in frequency and/or severity, the patient is to resume the previous dose.  For now, continue montelukast 10 mg daily at bedtime and albuterol HFA, 1-2 inhalations every 4-6 hours as needed.  Subjective and objective measures of pulmonary function will be followed and the treatment plan will be adjusted accordingly.  Seasonal and perennial allergic rhinitis  For now, continue appropriate allergen avoidance measures and montelukast 10 mg daily bedtime.  For thick postnasal drainage, guaifenesin 1200 mg (Mucinex maximum strength) twice daily as needed with adequate hydration as discussed.   Nasal saline lavage (NeilMed) as needed has been recommended along with instructions for proper administration.  Seasonal allergic conjunctivitis  Treatment plan as outlined above for allergic  rhinitis.  A prescription has been provided for Pazeo, one drop per eye daily as needed.   Meds ordered this encounter  Medications  . mometasone-formoterol (DULERA) 100-5 MCG/ACT AERO    Sig: Inhale 2 puffs into the lungs 2 (two) times daily.    Dispense:  1 Inhaler    Refill:  5    PLACE ON HOLD, PATIENT WILL CALL.  Marland Kitchen Olopatadine HCl (PAZEO) 0.7 % SOLN    Sig: Place 1 drop into both eyes daily as needed.    Dispense:  1 Bottle    Refill:  5    Diagnositics: Spirometry:  Normal with an FEV1 of 108% predicted.  Please see scanned spirometry results for details.    Physical examination: Blood pressure 124/60, pulse 78, temperature 98.8 F (37.1 C), temperature source Oral.  General: Alert, interactive, in no acute distress. HEENT: TMs pearly gray, turbinates mildly edematous without discharge, post-pharynx mildly erythematous. Neck: Supple without lymphadenopathy. Lungs: Clear to auscultation without wheezing, rhonchi or rales. CV: Normal S1, S2 without murmurs. Skin: Warm and dry, without lesions or rashes.  The following portions of the patient's history were reviewed and updated as appropriate: allergies, current medications, past family history, past medical history, past social history, past surgical history and problem list.    Medication List       Accurate as of 11/30/15  1:29 PM. Always use your most recent med list.          albuterol 108 (90 Base) MCG/ACT inhaler Commonly known as:  PROVENTIL HFA;VENTOLIN HFA Inhale 2 puffs into the lungs every 6 (six) hours as needed (USE SPACER). For shortness of breath   ibuprofen 200 MG tablet Commonly known  as:  ADVIL,MOTRIN Take 600 mg by mouth every 6 (six) hours as needed. For teeth pain   mometasone-formoterol 200-5 MCG/ACT Aero Commonly known as:  DULERA Inhale 2 puffs into the lungs 2 (two) times daily. USE SPACER.   mometasone-formoterol 100-5 MCG/ACT Aero Commonly known as:  DULERA Inhale 2 puffs into  the lungs 2 (two) times daily.   montelukast 10 MG tablet Commonly known as:  SINGULAIR Take 1 tablet (10 mg total) by mouth at bedtime.   Olopatadine HCl 0.7 % Soln Commonly known as:  PAZEO Place 1 drop into both eyes daily as needed.       No Known Allergies  I appreciate the opportunity to take part in [place] care. Please do not hesitate to contact me with questions.  Sincerely,   R. Edgar Frisk, MD

## 2015-11-30 NOTE — Assessment & Plan Note (Signed)
   Treatment plan as outlined above for allergic rhinitis.  A prescription has been provided for Pazeo, one drop per eye daily as needed. 

## 2016-04-04 ENCOUNTER — Ambulatory Visit: Payer: PRIVATE HEALTH INSURANCE | Admitting: Allergy and Immunology

## 2017-05-26 ENCOUNTER — Emergency Department (HOSPITAL_BASED_OUTPATIENT_CLINIC_OR_DEPARTMENT_OTHER)
Admission: EM | Admit: 2017-05-26 | Discharge: 2017-05-26 | Disposition: A | Discharge status: Home / Self Care | Payer: Self-pay | Attending: Emergency Medicine | Admitting: Emergency Medicine

## 2017-05-26 ENCOUNTER — Encounter (HOSPITAL_BASED_OUTPATIENT_CLINIC_OR_DEPARTMENT_OTHER): Payer: Self-pay | Admitting: *Deleted

## 2017-05-26 ENCOUNTER — Other Ambulatory Visit: Payer: Self-pay

## 2017-05-26 DIAGNOSIS — F1210 Cannabis abuse, uncomplicated: Secondary | ICD-10-CM | POA: Insufficient documentation

## 2017-05-26 DIAGNOSIS — Y929 Unspecified place or not applicable: Secondary | ICD-10-CM | POA: Insufficient documentation

## 2017-05-26 DIAGNOSIS — R252 Cramp and spasm: Secondary | ICD-10-CM | POA: Insufficient documentation

## 2017-05-26 DIAGNOSIS — Z79899 Other long term (current) drug therapy: Secondary | ICD-10-CM | POA: Insufficient documentation

## 2017-05-26 DIAGNOSIS — Z87891 Personal history of nicotine dependence: Secondary | ICD-10-CM | POA: Insufficient documentation

## 2017-05-26 DIAGNOSIS — Y9389 Activity, other specified: Secondary | ICD-10-CM | POA: Insufficient documentation

## 2017-05-26 DIAGNOSIS — S29012A Strain of muscle and tendon of back wall of thorax, initial encounter: Secondary | ICD-10-CM | POA: Insufficient documentation

## 2017-05-26 DIAGNOSIS — J45909 Unspecified asthma, uncomplicated: Secondary | ICD-10-CM | POA: Insufficient documentation

## 2017-05-26 DIAGNOSIS — Y998 Other external cause status: Secondary | ICD-10-CM | POA: Insufficient documentation

## 2017-05-26 DIAGNOSIS — X58XXXA Exposure to other specified factors, initial encounter: Secondary | ICD-10-CM | POA: Insufficient documentation

## 2017-05-26 MED ORDER — TRAMADOL HCL 50 MG PO TABS: 50.0000 mg | ORAL_TABLET | Freq: Four times a day (QID) | ORAL | 0 refills | Status: DC | PRN

## 2017-05-26 MED ORDER — MELOXICAM 15 MG PO TABS: 15.0000 mg | ORAL_TABLET | Freq: Every day | ORAL | 0 refills | Status: DC

## 2017-05-26 MED ORDER — BACLOFEN 10 MG PO TABS: 10.0000 mg | ORAL_TABLET | Freq: Three times a day (TID) | ORAL | 0 refills | Status: DC

## 2017-05-26 MED FILL — BACLOFEN 10 MG TABS: 10 | 10 days supply | Qty: 30 | Fill #0

## 2017-05-26 MED FILL — MELOXICAM 15 MG TABLET: 15 | 30 days supply | Qty: 30 | Fill #0

## 2017-05-26 NOTE — Discharge Instructions (Signed)
SEEK IMMEDIATE MEDICAL ATTENTION IF: New numbness, tingling, weakness, or problem with the use of your arms or legs.  Severe back pain not relieved with medications.  Change in bowel or bladder control.  Increasing pain in any areas of the body (such as chest or abdominal pain).  Shortness of breath, dizziness or fainting.  Nausea (feeling sick to your stomach), vomiting, fever, or sweats.  

## 2017-05-26 NOTE — ED Provider Notes (Signed)
[place] EMERGENCY DEPARTMENT Provider Note   CSN: [id number] Arrival date & time: 05/26/17  1339     History   Chief Complaint Chief Complaint  Patient presents with  . Back Pain    HPI Amanda Villanueva is a 52 y.o. female who presents the emergency department with chief complaint of mid back pain.  Patient states that she was leaning over yesterday when she had sudden onset of acute back spasm in her mid back.  She denies any radiation down her legs or involvement of her upper extremities.  Pain is improved when she sits still, worse with any kind of movement and twisting.  She states that she was in tears today at work because it hurts for her to sit upright at her desk.  She took ibuprofen with some improvement in her symptoms.  She states that she is never had this happen before.  She denies difficulty breathing, cough, pleuritic chest pain.  HPI  Past Medical History:  Diagnosis Date  . Asthma     Patient Active Problem List   Diagnosis Date Noted  . Seasonal allergic conjunctivitis 11/30/2015  . Moderate persistent asthma 10/18/2015  . Seasonal and perennial allergic rhinitis 10/18/2015    Past Surgical History:  Procedure Laterality Date  . KNEE SURGERY     bilateral  . TUBAL LIGATION      OB History    No data available       Home Medications    Prior to Admission medications   Medication Sig Start Date End Date Taking? Authorizing Provider  albuterol (PROVENTIL HFA;VENTOLIN HFA) 108 (90 Base) MCG/ACT inhaler Inhale 2 puffs into the lungs every 6 (six) hours as needed (USE SPACER). For shortness of breath 10/18/15   Bobbitt, Sedalia Muta, MD  baclofen (LIORESAL) 10 MG tablet Take 1 tablet (10 mg total) by mouth 3 (three) times daily. 05/26/17   Margarita Mail, PA-C  ibuprofen (ADVIL,MOTRIN) 200 MG tablet Take 600 mg by mouth every 6 (six) hours as needed. For teeth pain    [provider]  meloxicam (MOBIC) 15 MG tablet Take 1 tablet  (15 mg total) by mouth daily. 05/26/17   Jailine Lieder, PA-C  mometasone-formoterol (DULERA) 200-5 MCG/ACT AERO Inhale 2 puffs into the lungs 2 (two) times daily. USE SPACER. 10/18/15   Bobbitt, Sedalia Muta, MD  traMADol (ULTRAM) 50 MG tablet Take 1 tablet (50 mg total) by mouth every 6 (six) hours as needed. 05/26/17   Margarita Mail, PA-C    Family History Family History  Problem Relation Age of Onset  . Stroke Mother   . Allergic rhinitis Father   . Pneumonia Daughter     Social History Social History   Tobacco Use  . Smoking status: Former Smoker    Last attempt to quit: 09/12/2012    Years since quitting: 4.7  . Smokeless tobacco: Never Used  Substance Use Topics  . Alcohol use: Yes  . Drug use: Yes    Types: Marijuana     Allergies   Patient has no known allergies.   Review of Systems Review of Systems Ten systems reviewed and are negative for acute change, except as noted in the HPI.    Physical Exam Updated Vital Signs BP 125/71 (BP Location: Left Arm)   Pulse 65   Temp 98.7 F (37.1 C) (Oral)   Resp 18   Ht 5\' 2"  (1.575 m)   Wt 72.6 kg (160 lb)   LMP 04/29/2017  SpO2 99%   BMI 29.26 kg/m   Physical Exam  Constitutional: She is oriented to person, place, and time. She appears well-developed and well-nourished. No distress.  HENT:  Head: Normocephalic and atraumatic.  Eyes: Conjunctivae are normal. No scleral icterus.  Neck: Normal range of motion.  Cardiovascular: Normal rate, regular rhythm and normal heart sounds. Exam reveals no gallop and no friction rub.  No murmur heard. Pulmonary/Chest: Effort normal and breath sounds normal. No respiratory distress.  Abdominal: Soft. Bowel sounds are normal. She exhibits no distension and no mass. There is no tenderness. There is no rebound and no guarding.  Musculoskeletal:  Tender to palpation in the bilateral lower trapezius, upper medial latissimus region, worse on the right, pain with twisting,  sitting upright, forward flexion.  Neurological: She is alert and oriented to person, place, and time.  Skin: Skin is warm and dry. She is not diaphoretic.  Psychiatric: Her behavior is normal.  Nursing note and vitals reviewed.    ED Treatments / Results  Labs (all labs ordered are listed, but only abnormal results are displayed) Labs Reviewed - No data to display  EKG  EKG Interpretation None       Radiology No results found.  Procedures Procedures (including critical care time)  Medications Ordered in ED Medications - No data to display   Initial Impression / Assessment and Plan / ED Course  I have reviewed the triage vital signs and the nursing notes.  Pertinent labs & imaging results that were available during my care of the patient were reviewed by me and considered in my medical decision making (see chart for details).     Patient with back pain.  No neurological deficits and normal neuro exam.  Patient can walk but states is painful.  No loss of bowel or bladder control.  No concern for cauda equina.  No fever, night sweats, weight loss, h/o cancer, IVDU.  RICE protocol and pain medicine indicated and discussed with patient.    Final Clinical Impressions(s) / ED Diagnoses   Final diagnoses:  Strain of mid-back, initial encounter    ED Discharge Orders        Ordered    meloxicam (MOBIC) 15 MG tablet  Daily     05/26/17 1552    baclofen (LIORESAL) 10 MG tablet  3 times daily     05/26/17 1552    traMADol (ULTRAM) 50 MG tablet  Every 6 hours PRN     05/26/17 1552       Margarita Mail, PA-C 05/26/17 1702    Fredia Sorrow, MD 05/28/17 [phone]

## 2017-05-26 NOTE — ED Triage Notes (Signed)
Pt c/o lower back pain x 6 days, increased pain with movt

## 2018-01-17 ENCOUNTER — Emergency Department (HOSPITAL_BASED_OUTPATIENT_CLINIC_OR_DEPARTMENT_OTHER)
Admission: EM | Admit: 2018-01-17 | Discharge: 2018-01-17 | Disposition: A | Discharge status: Home / Self Care | Payer: No Typology Code available for payment source | Attending: Emergency Medicine | Admitting: Emergency Medicine

## 2018-01-17 ENCOUNTER — Other Ambulatory Visit: Payer: Self-pay

## 2018-01-17 ENCOUNTER — Encounter (HOSPITAL_BASED_OUTPATIENT_CLINIC_OR_DEPARTMENT_OTHER): Payer: Self-pay | Admitting: Emergency Medicine

## 2018-01-17 DIAGNOSIS — J45909 Unspecified asthma, uncomplicated: Secondary | ICD-10-CM | POA: Diagnosis not present

## 2018-01-17 DIAGNOSIS — M545 Low back pain, unspecified: Secondary | ICD-10-CM

## 2018-01-17 DIAGNOSIS — Z79899 Other long term (current) drug therapy: Secondary | ICD-10-CM | POA: Insufficient documentation

## 2018-01-17 DIAGNOSIS — Z87891 Personal history of nicotine dependence: Secondary | ICD-10-CM | POA: Diagnosis not present

## 2018-01-17 MED ORDER — TRAMADOL HCL 50 MG PO TABS: 50.0000 mg | ORAL_TABLET | Freq: Four times a day (QID) | ORAL | 0 refills | Status: DC | PRN

## 2018-01-17 MED ORDER — MELOXICAM 15 MG PO TABS: 15.0000 mg | ORAL_TABLET | Freq: Every day | ORAL | 1 refills | Status: DC

## 2018-01-17 MED ORDER — BACLOFEN 10 MG PO TABS: 10.0000 mg | ORAL_TABLET | Freq: Three times a day (TID) | ORAL | 0 refills | Status: DC

## 2018-01-17 MED ORDER — HYDROMORPHONE HCL 1 MG/ML IJ SOLN
2.0000 mg | Freq: Once | INTRAMUSCULAR | Status: AC
  Administered 2018-01-17: 2 mg via INTRAMUSCULAR
  Filled 2018-01-17: qty 2

## 2018-01-17 NOTE — ED Notes (Signed)
Pt understood dc material. NAD Noted. Scripts and doctors note given at [employer]

## 2018-01-17 NOTE — ED Triage Notes (Signed)
Pt c/o lower back pain that has progressively worsened this week.

## 2018-01-17 NOTE — Discharge Instructions (Signed)
Take the medications as directed.  Take the meloxicam and/or Mobic on a regular basis take the baclofen on a regular basis.  Motrin can be substituted for the Mobic.  Tramadol to substitute for additional pain relief.  Work note provided.  If not improving over the next several days follow-up.

## 2018-01-17 NOTE — ED Provider Notes (Signed)
[place] EMERGENCY DEPARTMENT Provider Note   CSN: [id number] Arrival date & time: 01/17/18  0636     History   Chief Complaint Chief Complaint  Patient presents with  . Back Pain    HPI Amanda Villanueva is a 52 y.o. female.  Patient presents with exacerbation of low back pain.  On both sides does radiate into the upper part of the thighs.  No radiation of pain into the calves no numbness or weakness to the legs.  No history of injury.  Patient was seen in January with similar complaints.  Has been taking the medication she was prescribed by her physician assistant at that time on and off with fair amount of relief.     Past Medical History:  Diagnosis Date  . Asthma     Patient Active Problem List   Diagnosis Date Noted  . Seasonal allergic conjunctivitis 11/30/2015  . Moderate persistent asthma 10/18/2015  . Seasonal and perennial allergic rhinitis 10/18/2015    Past Surgical History:  Procedure Laterality Date  . KNEE SURGERY     bilateral  . TUBAL LIGATION       OB History   None      Home Medications    Prior to Admission medications   Medication Sig Start Date End Date Taking? Authorizing Provider  albuterol (PROVENTIL HFA;VENTOLIN HFA) 108 (90 Base) MCG/ACT inhaler Inhale 2 puffs into the lungs every 6 (six) hours as needed (USE SPACER). For shortness of breath 10/18/15  Yes Bobbitt, Sedalia Muta, MD  mometasone-formoterol [facility]) 200-5 MCG/ACT AERO Inhale 2 puffs into the lungs 2 (two) times daily. USE SPACER. 10/18/15  Yes Bobbitt, Sedalia Muta, MD  baclofen (LIORESAL) 10 MG tablet Take 1 tablet (10 mg total) by mouth 3 (three) times daily. 01/17/18   Fredia Sorrow, MD  meloxicam (MOBIC) 15 MG tablet Take 1 tablet (15 mg total) by mouth daily. 01/17/18   Fredia Sorrow, MD  traMADol (ULTRAM) 50 MG tablet Take 1 tablet (50 mg total) by mouth every 6 (six) hours as needed. 01/17/18   Fredia Sorrow, MD    Family History Family History    Problem Relation Age of Onset  . Stroke Mother   . Allergic rhinitis Father   . Pneumonia Daughter     Social History Social History   Tobacco Use  . Smoking status: Former Smoker    Last attempt to quit: 09/12/2012    Years since quitting: 5.3  . Smokeless tobacco: Never Used  Substance Use Topics  . Alcohol use: Yes  . Drug use: Yes    Types: Marijuana     Allergies   Patient has no known allergies.   Review of Systems Review of Systems  Constitutional: Negative for fever.  HENT: Negative for congestion.   Respiratory: Negative for shortness of breath.   Cardiovascular: Negative for chest pain.  Gastrointestinal: Negative for abdominal pain.  Genitourinary: Negative for difficulty urinating.  Musculoskeletal: Positive for back pain.  Skin: Negative for rash.  Neurological: Negative for weakness and numbness.  Hematological: Does not bruise/bleed easily.  Psychiatric/Behavioral: Negative for confusion.     Physical Exam Updated Vital Signs BP (!) 134/113   Pulse 81   Temp 98.1 F (36.7 C) (Oral)   Resp 20   Ht 1.575 m (5\' 2" )   Wt 72.6 kg   SpO2 98%   BMI 29.26 kg/m   Physical Exam  Constitutional: She is oriented to person, place, and time. She appears well-developed and  well-nourished. No distress.  HENT:  Head: Normocephalic and atraumatic.  Eyes: Pupils are equal, round, and reactive to light. EOM are normal.  Neck: Neck supple.  Cardiovascular: Normal rate, regular rhythm and normal heart sounds.  Pulmonary/Chest: Effort normal and breath sounds normal. No respiratory distress.  Abdominal: Soft. Bowel sounds are normal. There is no tenderness.  Musculoskeletal: Normal range of motion. She exhibits no edema.  Dorsalis pedis pulse one plus both feet.  Good cap refill.  Sensation intact.  Good muscle strength to the foot toes.  Bilaterally.  Neurological: She is alert and oriented to person, place, and time. No cranial nerve deficit or sensory  deficit. She exhibits normal muscle tone. Coordination normal.  Skin: Skin is warm. Capillary refill takes less than 2 seconds. No rash noted.  Nursing note and vitals reviewed.    ED Treatments / Results  Labs (all labs ordered are listed, but only abnormal results are displayed) Labs Reviewed - No data to display  EKG None  Radiology No results found.  Procedures Procedures (including critical care time)  Medications Ordered in ED Medications  HYDROmorphone (DILAUDID) injection 2 mg (2 mg Intramuscular Given 01/17/18 0726)     Initial Impression / Assessment and Plan / ED Course  I have reviewed the triage vital signs and the nursing notes.  Pertinent labs & imaging results that were available during my care of the patient were reviewed by me and considered in my medical decision making (see chart for details).     Patient with an exacerbation of low back pain with a history of low back pain.  No signs of sciatica or cauda equina.  Patient was improvement with hydromorphone IM.  Will re-prescribe the medications that helped her and back in January.  Work note provided.  Final Clinical Impressions(s) / ED Diagnoses   Final diagnoses:  Acute bilateral low back pain without sciatica    ED Discharge Orders         Ordered    meloxicam (MOBIC) 15 MG tablet  Daily     01/17/18 0811    baclofen (LIORESAL) 10 MG tablet  3 times daily     01/17/18 0811    traMADol (ULTRAM) 50 MG tablet  Every 6 hours PRN     01/17/18 0811           Fredia Sorrow, MD 01/17/18 [phone]

## 2018-05-11 ENCOUNTER — Emergency Department (HOSPITAL_BASED_OUTPATIENT_CLINIC_OR_DEPARTMENT_OTHER): Payer: No Typology Code available for payment source

## 2018-05-11 ENCOUNTER — Emergency Department (HOSPITAL_BASED_OUTPATIENT_CLINIC_OR_DEPARTMENT_OTHER)
Admission: EM | Admit: 2018-05-11 | Discharge: 2018-05-11 | Disposition: A | Discharge status: Home / Self Care | Payer: No Typology Code available for payment source | Attending: Emergency Medicine | Admitting: Emergency Medicine

## 2018-05-11 ENCOUNTER — Other Ambulatory Visit: Payer: Self-pay

## 2018-05-11 ENCOUNTER — Encounter (HOSPITAL_BASED_OUTPATIENT_CLINIC_OR_DEPARTMENT_OTHER): Payer: Self-pay | Admitting: *Deleted

## 2018-05-11 DIAGNOSIS — M25561 Pain in right knee: Secondary | ICD-10-CM

## 2018-05-11 DIAGNOSIS — J45909 Unspecified asthma, uncomplicated: Secondary | ICD-10-CM | POA: Diagnosis not present

## 2018-05-11 DIAGNOSIS — W010XXA Fall on same level from slipping, tripping and stumbling without subsequent striking against object, initial encounter: Secondary | ICD-10-CM | POA: Diagnosis not present

## 2018-05-11 DIAGNOSIS — W19XXXA Unspecified fall, initial encounter: Secondary | ICD-10-CM

## 2018-05-11 DIAGNOSIS — Z79899 Other long term (current) drug therapy: Secondary | ICD-10-CM | POA: Insufficient documentation

## 2018-05-11 DIAGNOSIS — Z87891 Personal history of nicotine dependence: Secondary | ICD-10-CM | POA: Insufficient documentation

## 2018-05-11 MED ORDER — KETOROLAC TROMETHAMINE 15 MG/ML IJ SOLN
15.0000 mg | Freq: Once | INTRAMUSCULAR | Status: AC
Start: 2018-05-11 — End: 2018-05-11
  Administered 2018-05-11: 15 mg via INTRAVENOUS
  Filled 2018-05-11: qty 1

## 2018-05-11 NOTE — ED Notes (Signed)
ED Provider at bedside. 

## 2018-05-11 NOTE — Discharge Instructions (Addendum)
You have been diagnosed today with right knee pain after fall.  At this time there does not appear to be the presence of an emergent medical condition, however there is always the potential for conditions to change. Please read and follow the below instructions.  Please return to the Emergency Department immediately for any new or worsening symptoms. Please be sure to follow up with your Primary Care Provider this week regarding your visit today; please call their office to schedule an appointment even if you are feeling better for a follow-up visit. Please call Dr. Ericka Pontiff office, sports medicine, to schedule follow-up appointment regarding your arthritis and knee pain. Please use rest, ice and elevation to help with your symptoms.  You may use the crutches given to you today to help keep weight off your right knee. You have been given a Toradol shot today, this is an anti-inflammatory medication.  Please avoid further NSAID use such as ibuprofen/Motrin/Advil over the next 2 days.  Please drink plenty of water. Your x-rays today showed arthritis, please discuss this with your primary care doctor and the sports medicine doctor.  Additionally x-rays do not show a ligament or tendon injury which may still be present, if your pain does not improve or if it worsens further evaluation will be necessary.  Get help right away if: Your knee swells, and the swelling gets worse. You cannot move your knee. You have very bad knee pain. The knee pain does not stop. The knee pain changes or gets worse. You have a fever along with knee pain. Your knee feels warm when you touch it. Your knee gives out or locks up.   Please read the additional information packets attached to your discharge summary.  Do not take your medicine if  develop an itchy rash, swelling in your mouth or lips, or difficulty breathing.

## 2018-05-11 NOTE — ED Notes (Signed)
Patient transported to X-ray 

## 2018-05-11 NOTE — ED Triage Notes (Signed)
Pt reports trip and fall at 1am onto both knees. She has had bilateral knee reconstruction in the past and is concerned. Pt initially declines w/c, amb to triage with slow, steady gait in nad. Denies any other injury or c/o.

## 2018-05-11 NOTE — ED Provider Notes (Signed)
[facility] EMER[facility]ENCY DEPARTMENT Provider Note   CSN: [id number] Arrival date & time: 05/11/18  4481     History   Chief Complaint Chief Complaint  Patient presents with  . Fall    HPI Amanda Villanueva is a 53 y.o. female presenting after a fall that occurred at 1 AM.  Patient states that she was walking around her house when she tripped on a box falling onto both of her knees at the same time.  Patient denies head injury or loss of consciousness.  Patient denies any other injury aside from bilateral knees.  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  Patient describes her pain on her right knee as a moderate intensity constant throbbing pain that is worsened with palpation and ambulation.  She has taken Tylenol without relief of her symptoms prior to arrival.  Patient also endorses left knee pain however states that this pain is mild in intensity when compared to right.  HPI  Past Medical History:  Diagnosis Date  . Asthma     Patient Active Problem List   Diagnosis Date Noted  . Seasonal allergic conjunctivitis 11/30/2015  . Moderate persistent asthma 10/18/2015  . Seasonal and perennial allergic rhinitis 10/18/2015    Past Surgical History:  Procedure Laterality Date  . KNEE SURGERY     bilateral  . TUBAL LIGATION       OB History   No obstetric history on file.      Home Medications    Prior to Admission medications   Medication Sig Start Date End Date Taking? Authorizing Provider  albuterol (PROVENTIL HFA;VENTOLIN HFA) 108 (90 Base) MCG/ACT inhaler Inhale 2 puffs into the lungs every 6 (six) hours as needed (USE SPACER). For shortness of breath 10/18/15   Bobbitt, Sedalia Muta, MD  baclofen (LIORESAL) 10 MG tablet Take 1 tablet (10 mg total) by mouth 3 (three) times daily. 01/17/18   Fredia Sorrow, MD  meloxicam  (MOBIC) 15 MG tablet Take 1 tablet (15 mg total) by mouth daily. 01/17/18   Fredia Sorrow, MD  mometasone-formoterol (DULERA) 200-5 MCG/ACT AERO Inhale 2 puffs into the lungs 2 (two) times daily. USE SPACER. 10/18/15   Bobbitt, Sedalia Muta, MD  traMADol (ULTRAM) 50 MG tablet Take 1 tablet (50 mg total) by mouth every 6 (six) hours as needed. 01/17/18   Fredia Sorrow, MD    Family History Family History  Problem Relation Age of Onset  . Stroke Mother   . Allergic rhinitis Father   . Pneumonia Daughter     Social History Social History   Tobacco Use  . Smoking status: Former Smoker    Last attempt to quit: 09/12/2012    Years since quitting: 5.6  . Smokeless tobacco: Never Used  Substance Use Topics  . Alcohol use: Yes  . Drug use: Yes    Types: Marijuana     Allergies   Patient has no known allergies.   Review of Systems Review of Systems  Constitutional: Negative.  Negative for chills and fever.  Respiratory: Negative.  Negative for cough and shortness of breath.   Cardiovascular: Negative.  Negative for chest pain.  Gastrointestinal: Negative.  Negative for abdominal pain, diarrhea, nausea and vomiting.  Musculoskeletal: Positive for arthralgias. Negative for back pain, myalgias, neck pain and neck stiffness.  Skin: Negative.  Negative for rash and  wound.  Neurological: Negative.  Negative for dizziness, weakness, numbness and headaches.     Physical Exam Updated Vital Signs BP (!) 143/84 (BP Location: Right Arm)   Pulse 64   Temp 98.6 F (37 C) (Oral)   Resp 18   Ht 5\' 2"  (1.575 m)   Wt 72.6 kg   LMP 05/11/2018   SpO2 99%   BMI 29.26 kg/m   Physical Exam Constitutional:      General: She is not in acute distress.    Appearance: Normal appearance. She is well-developed. She is not ill-appearing or diaphoretic.  HENT:     Head: Normocephalic and atraumatic.     Right Ear: Hearing, tympanic membrane, ear canal and external ear normal. No hemotympanum.      Left Ear: Hearing, tympanic membrane, ear canal and external ear normal. No hemotympanum.     Nose: Nose normal.     Mouth/Throat:     Lips: Pink.     Mouth: Mucous membranes are moist.     Pharynx: Oropharynx is clear. Uvula midline.  Eyes:     General: Vision grossly intact. Gaze aligned appropriately.     Extraocular Movements: Extraocular movements intact.     Conjunctiva/sclera: Conjunctivae normal.     Pupils: Pupils are equal, round, and reactive to light.  Neck:     Musculoskeletal: Full passive range of motion without pain, normal range of motion and neck supple. No spinous process tenderness or muscular tenderness.     Trachea: Trachea and phonation normal. No tracheal deviation.  Cardiovascular:     Rate and Rhythm: Normal rate and regular rhythm.     Pulses: Normal pulses.          Dorsalis pedis pulses are 2+ on the right side and 2+ on the left side.       Posterior tibial pulses are 2+ on the right side and 2+ on the left side.     Heart sounds: Normal heart sounds.  Pulmonary:     Effort: Pulmonary effort is normal. No respiratory distress.     Breath sounds: Normal breath sounds and air entry. No decreased breath sounds.  Abdominal:     Palpations: Abdomen is soft.     Tenderness: There is no abdominal tenderness. There is no guarding or rebound.  Musculoskeletal: Normal range of motion.        General: No swelling, deformity or signs of injury.     Right hip: Normal.     Left hip: Normal.     Right knee: She exhibits normal range of motion, no swelling, no ecchymosis, no deformity and no erythema. Tenderness found. Patellar tendon tenderness noted.     Left knee: She exhibits normal range of motion, no swelling, no ecchymosis, no deformity and no erythema. Tenderness found. Patellar tendon tenderness noted.     Right ankle: Normal.     Left ankle: Normal.     Lumbar back: Normal.     Right lower leg: Normal. No edema.     Left lower leg: Normal. No edema.        Legs:     Right foot: Normal.     Left foot: Normal.     Comments: No midline C/T/L spinal tenderness to palpation, no paraspinal muscle tenderness, no deformity, crepitus, or step-off noted. No sign of injury to the neck or back.  No tenderness palpation of the hips.  Hips stable to compression bilaterally.  Patient able to bring knee-to-chest without pain.  Right Knee: Well-healed surgical scar present without other abnormalities. No obvious deformity. No skin swelling, erythema, heat, fluctuance or break of the skin.  Tenderness to palpation over patella tendon.  Patellar tendon intact, straight leg test strong. Active and passive flexion and extension intact without or crepitus. Negative anterior/poster drawer bilaterally. No varus or valgus laxity or locking. No tenderness to palpation of hips or ankles.Compartments soft. Neurovascularly intact distally to site of injury.  Left Knee: Well-healed surgical scar present without other abnormalities. No obvious deformity. No skin swelling, erythema, heat, fluctuance or break of the skin.  Tenderness to palpation over patella tendon.  Patellar tendon intact, straight leg test strong. Active and passive flexion and extension intact without or crepitus. Negative anterior/poster drawer bilaterally. No varus or valgus laxity or locking. No tenderness to palpation of hips or ankles.Compartments soft. Neurovascularly intact distally to site of injury.  Feet:     Right foot:     Protective Sensation: 3 sites tested. 3 sites sensed.     Left foot:     Protective Sensation: 3 sites tested. 3 sites sensed.  Skin:    General: Skin is warm and dry.     Capillary Refill: Capillary refill takes less than 2 seconds.  Neurological:     General: No focal deficit present.     Mental Status: She is alert and oriented to person, place, and time.     GCS: GCS eye subscore is 4. GCS verbal subscore is 5. GCS motor subscore is 6.     Comments: Mental  Status: Alert, oriented, thought content appropriate, able to give a coherent history. Speech fluent without evidence of aphasia. Able to follow 2 step commands without difficulty. Cranial Nerves: II: Peripheral visual fields grossly normal, pupils equal, round, reactive to light III,IV, VI: ptosis not present, extra-ocular motions intact bilaterally V,VII: smile symmetric, eyebrows raise symmetric, facial light touch sensation equal VIII: hearing grossly normal to voice X: uvula elevates symmetrically XI: bilateral shoulder shrug symmetric and strong XII: midline tongue extension without fassiculations Motor: Normal tone. 5/5 strength in upper and lower extremities bilaterally including strong and equal grip strength and dorsiflexion/plantar flexion Sensory: Sensation intact to light touch in all extremities.Negative Romberg.  Cerebellar: normal finger-to-nose with bilateral upper extremities. Normal heel-to -shin balance bilaterally of the lower extremity. No pronator drift.  Gait: gait slow but steady without assistance and normal balance CV: distal pulses palpable throughout  Psychiatric:        Mood and Affect: Mood normal.        Behavior: Behavior normal.    ED Treatments / Results  Labs (all labs ordered are listed, but only abnormal results are displayed) Labs Reviewed - No data to display  EKG None  Radiology Dg Knee Complete 4 Views Left  Result Date: 05/11/2018 CLINICAL DATA:  Status post fall, pain EXAM: RIGHT KNEE - COMPLETE 4+ VIEW; LEFT KNEE - COMPLETE 4+ VIEW COMPARISON:  None. FINDINGS: Right knee: No evidence of fracture, dislocation, or joint effusion. 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 Soft tissues are unremarkable. Left knee: No evidence of fracture, dislocation, or joint effusion. 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 Soft tissues are unremarkable. IMPRESSION: No acute osseous injury of bilateral  knees. 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 Electronically Signed   By: Kathreen Devoid   On: 05/11/2018 14:02   Dg Knee Complete 4 Views Right  Result Date: 05/11/2018 CLINICAL DATA:  Status post fall, pain EXAM: RIGHT KNEE -  COMPLETE 4+ VIEW; LEFT KNEE - COMPLETE 4+ VIEW COMPARISON:  None. FINDINGS: Right knee: No evidence of fracture, dislocation, or joint effusion. 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 Soft tissues are unremarkable. Left knee: No evidence of fracture, dislocation, or joint effusion. 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 Soft tissues are unremarkable. IMPRESSION: No acute osseous injury of bilateral knees. 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 Electronically Signed   By: Kathreen Devoid   On: 05/11/2018 14:02    Procedures Procedures (including critical care time)  Medications Ordered in ED Medications  ketorolac (TORADOL) 15 MG/ML injection 15 mg (15 mg Intravenous Given 05/11/18 1333)     Initial Impression / Assessment and Plan / ED Course  I have reviewed the triage vital signs and the nursing notes.  Pertinent labs & imaging results that were available during my care of the patient were reviewed by me and considered in my medical decision making (see chart for details).    53 year old female presenting today after fall that occurred at 1 AM.  Bilateral knee pain with history of reconstructions approximately 30 years ago.  No obvious injury on initial examination and patient is ambulatory upon arrival.  Patient denies history of CKD or stomach bleeding, she was given 15 mg of Toradol with improvement of symptoms.  Patient with improved ambulation and agreeable to discharge. Extremites neurovascularly intact; no signs of infection, septic joint, DVT, compartment syndrome. Patient with Full ROM and 5/5 strength with all movements.  Patient given crutches  for comfort.  Her right knee pain is greater than her left.  Patient informed of rest, ice, compression and elevation.  Patient informed to avoid further NSAID use for the next 2 days and to drink water.  Patient informed of x-ray findings of osteoarthritis and has been given sports medicine follow-up.  No obvious tendon/ligamentous injury however patient informed that these are still possible despite imaging today and to follow-up with sports medicine if symptoms do not improve, or worsen.  At discharge patient is afebrile, not tachycardic, not hypotensive, well-appearing and in no acute distress.  She is ambulatory without assistance or difficulty.  At this time there does not appear to be any evidence of an acute emergency medical condition and the patient appears stable for discharge with appropriate outpatient follow up. Diagnosis was discussed with patient who verbalizes understanding of care plan and is agreeable to discharge. I have discussed return precautions with patient who verbalizes understanding of return precautions. Patient strongly encouraged to follow-up with their PCP this week. All questions answered.  Note: Portions of this report may have been transcribed using voice recognition software. Every effort was made to ensure accuracy; however, inadvertent computerized transcription errors may still be present. Final Clinical Impressions(s) / ED Diagnoses   Final diagnoses:  Fall, initial encounter  Acute pain of right knee    ED Discharge Orders    None       Gari Crown 05/11/18 1607    Blanchie Dessert, MD 05/11/18 2230

## 2018-05-18 ENCOUNTER — Ambulatory Visit (INDEPENDENT_AMBULATORY_CARE_PROVIDER_SITE_OTHER): Payer: No Typology Code available for payment source | Admitting: Family Medicine

## 2018-05-18 ENCOUNTER — Encounter: Payer: Self-pay | Admitting: Family Medicine

## 2018-05-18 VITALS — BP 133/84 | HR 75 | Ht 62.0 in | Wt 160.0 lb

## 2018-05-18 DIAGNOSIS — M25561 Pain in right knee: Secondary | ICD-10-CM | POA: Diagnosis not present

## 2018-05-18 DIAGNOSIS — M25562 Pain in left knee: Secondary | ICD-10-CM

## 2018-05-18 MED ORDER — DICLOFENAC SODIUM 75 MG PO TBEC: 75.0000 mg | DELAYED_RELEASE_TABLET | Freq: Two times a day (BID) | ORAL | 1 refills | Status: DC

## 2018-05-18 NOTE — Progress Notes (Signed)
PCP: Patient, No Pcp Per  Subjective:   HPI: Patient is a 53 y.o. female here for bilateral knee pain.  Patient reports last week she was walking around her house around 1 AM when she tripped over a box and fell directly onto bilateral anterior knees. She has had problems with these knees previously and in her 69s had what sounds like medial patellofemoral ligament repairs bilaterally. Pain currently is burning and worse in the evenings, also worse with prolonged walking. He did not feel as stable as they had previously. Pain on the left knee is a 6 out of 10, on the right is 8 out of 10. Knees are popping. Some of the left knee pain is more lateral as well. She has been elevating, using knee sleeves, and using heat. She is taking meloxicam and baclofen. No skin changes or numbness.  Past Medical History:  Diagnosis Date  . Asthma     Current Outpatient Medications on File Prior to Visit  Medication Sig Dispense Refill  . albuterol (PROVENTIL HFA;VENTOLIN HFA) 108 (90 Base) MCG/ACT inhaler Inhale 2 puffs into the lungs every 6 (six) hours as needed (USE SPACER). For shortness of breath 8 g 2  . baclofen (LIORESAL) 10 MG tablet Take 1 tablet (10 mg total) by mouth 3 (three) times daily. 30 each 0  . mometasone-formoterol (DULERA) 200-5 MCG/ACT AERO Inhale 2 puffs into the lungs 2 (two) times daily. USE SPACER. 1 Inhaler 5  . traMADol (ULTRAM) 50 MG tablet Take 1 tablet (50 mg total) by mouth every 6 (six) hours as needed. 10 tablet 0   Current Facility-Administered Medications on File Prior to Visit  Medication Dose Route Frequency Provider Last Rate Last Dose  . predniSONE (DELTASONE) tablet 10 mg  10 mg Oral UD Bobbitt, Sedalia Muta, MD        Past Surgical History:  Procedure Laterality Date  . KNEE SURGERY     bilateral  . TUBAL LIGATION      No Known Allergies  Social History   Socioeconomic History  . Marital status: Single    Spouse name: Not on file  . Number  of children: Not on file  . Years of education: Not on file  . Highest education level: Not on file  Occupational History  . Not on file  Social Needs  . Financial resource strain: Not on file  . Food insecurity:    Worry: Not on file    Inability: Not on file  . Transportation needs:    Medical: Not on file    Non-medical: Not on file  Tobacco Use  . Smoking status: Former Smoker    Last attempt to quit: 09/12/2012    Years since quitting: 5.6  . Smokeless tobacco: Never Used  Substance and Sexual Activity  . Alcohol use: Yes  . Drug use: Yes    Types: Marijuana  . Sexual activity: Yes    Birth control/protection: Surgical  Lifestyle  . Physical activity:    Days per week: Not on file    Minutes per session: Not on file  . Stress: Not on file  Relationships  . Social connections:    Talks on phone: Not on file    Gets together: Not on file    Attends religious service: Not on file    Active member of club or organization: Not on file    Attends meetings of clubs or organizations: Not on file    Relationship status: Not on file  .  Intimate partner violence:    Fear of current or ex partner: Not on file    Emotionally abused: Not on file    Physically abused: Not on file    Forced sexual activity: Not on file  Other Topics Concern  . Not on file  Social History Narrative  . Not on file    Family History  Problem Relation Age of Onset  . Stroke Mother   . Allergic rhinitis Father   . Pneumonia Daughter     BP 133/84   Pulse 75   Ht 5\' 2"  (1.575 m)   Wt 160 lb (72.6 kg)   LMP 05/11/2018   BMI 29.26 kg/m   Review of Systems: See HPI above.     Objective:  Physical Exam:  Gen: NAD, comfortable in exam room  Right knee: Well healed anterior scar over superior patella.  Hypermobile patella.  No other gross deformity, ecchymoses, swelling/effusion. TTP diffusely anterior knee including joint lines. FROM with 5/5 strength flexion and  extension. Negative ant/post drawers. Negative valgus/varus testing. Negative lachmans. Pain diffusely with mcmurrays, apleys, patellar apprehension. NV intact distally.  Left knee: Well healed anterior scar over superior patella.  Hypermobile patella.  No other gross deformity, ecchymoses, swelling/effusion. TTP diffusely anterior knee including joint lines. FROM with 5/5 strength flexion and extension. Negative ant/post drawers. Negative valgus/varus testing. Negative lachmans. Pain diffusely with mcmurrays, apleys, patellar apprehension. NV intact distally.   Assessment & Plan:  1. Bilateral knee pain - independently reviewed radiographs and no evidence of fracture.  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  We will switch her from meloxicam to diclofenac twice a day with food.  She can take Tylenol as needed.  Salon pas patches, topical medications reviewed.  Ice or heat.  He braces when up walking around.  Follow-up in 1 month.  Encouraged to do home exercises if tolerated.

## 2018-05-18 NOTE — Patient Instructions (Signed)
You have severe knee contusions with underlying patellar hypermobility. Diclofenac twice a day with food for pain and inflammation. Ok to take tylenol 500mg  1-2 tabs three times a day with this. Stop the meloxicam;  Don't take ibuprofen or aleve. Salon pas patches, topical aspercreme or biofreeze up to 4 times a day can help with the pain as well. Ice or heat 15 minutes at a time 3-4 times a day. Knee braces when up and walking around. Follow up with me in 1 month for this issue. If tolerated do simple knee extensions and straight leg raises to maintain quad strength - up to 3 sets of 10 once a day

## 2018-06-17 ENCOUNTER — Ambulatory Visit: Payer: No Typology Code available for payment source | Admitting: Family Medicine

## 2019-07-13 ENCOUNTER — Ambulatory Visit (INDEPENDENT_AMBULATORY_CARE_PROVIDER_SITE_OTHER): Payer: No Typology Code available for payment source | Admitting: Family Medicine

## 2019-07-13 ENCOUNTER — Encounter: Payer: Self-pay | Admitting: Family Medicine

## 2019-07-13 ENCOUNTER — Other Ambulatory Visit: Payer: Self-pay

## 2019-07-13 VITALS — BP 150/96 | HR 73 | Ht 62.0 in | Wt 155.0 lb

## 2019-07-13 DIAGNOSIS — G5603 Carpal tunnel syndrome, bilateral upper limbs: Secondary | ICD-10-CM

## 2019-07-13 DIAGNOSIS — G5601 Carpal tunnel syndrome, right upper limb: Secondary | ICD-10-CM | POA: Insufficient documentation

## 2019-07-13 DIAGNOSIS — G5702 Lesion of sciatic nerve, left lower limb: Secondary | ICD-10-CM

## 2019-07-13 MED ORDER — KETOROLAC TROMETHAMINE 30 MG/ML IJ SOLN
30.0000 mg | Freq: Once | INTRAMUSCULAR | Status: AC
  Administered 2019-07-13: 30 mg via INTRAMUSCULAR

## 2019-07-13 MED ORDER — PREDNISONE 5 MG PO TABS: ORAL_TABLET | ORAL | 0 refills | Status: DC

## 2019-07-13 MED ORDER — CYCLOBENZAPRINE HCL 10 MG PO TABS: ORAL_TABLET | ORAL | 0 refills | Status: DC

## 2019-07-13 NOTE — Progress Notes (Signed)
Amanda Villanueva - 54 y.o. female MRN [id number]  Date of birth: [birth year]  SUBJECTIVE:  Including CC & ROS.  Chief Complaint  Patient presents with  . Back Pain    left-sided low back x 2 weeks    Amanda Villanueva is a 54 y.o. female that is presenting with left leg pain and buttock pain.  She is also presenting with altered sensation in the palmar aspect of the left and right hand.  The pain in the leg and buttock started about 2 weeks ago.  She has been caring for her fianc and has had recent surgery on the abdomen.  She also has a daughter who has multiple sclerosis.  She denies any new or different activities.  She sits for work.  She has to work using the computer and it seems to make her symptoms worse in her hands.  She has been using a brace on her right chest improve her symptoms.  She had an infection on the left hand which needed surgery.  This occurred on the fourth and fifth digit palmar aspect.   Review of Systems See HPI   HISTORY: Past Medical, Surgical, Social, and Family History Reviewed & Updated per EMR.   Pertinent Historical Findings include:  Past Medical History:  Diagnosis Date  . Asthma     Past Surgical History:  Procedure Laterality Date  . KNEE SURGERY     bilateral  . TUBAL LIGATION      Family History  Problem Relation Age of Onset  . Stroke Mother   . Allergic rhinitis Father   . Pneumonia Daughter     Social History   Socioeconomic History  . Marital status: Single    Spouse name: Not on file  . Number of children: Not on file  . Years of education: Not on file  . Highest education level: Not on file  Occupational History  . Not on file  Tobacco Use  . Smoking status: Former Smoker    Quit date: 09/12/2012    Years since quitting: 6.8  . Smokeless tobacco: Never Used  Substance and Sexual Activity  . Alcohol use: Yes  . Drug use: Yes    Types: Marijuana  . Sexual activity: Yes    Birth control/protection: Surgical  Other Topics  Concern  . Not on file  Social History Narrative  . Not on file   Social Determinants of Health   Financial Resource Strain:   . Difficulty of Paying Living Expenses: Not on file  Food Insecurity:   . Worried About Charity fundraiser in the Last Year: Not on file  . Ran Out of Food in the Last Year: Not on file  Transportation Needs:   . Lack of Transportation (Medical): Not on file  . Lack of Transportation (Non-Medical): Not on file  Physical Activity:   . Days of Exercise per Week: Not on file  . Minutes of Exercise per Session: Not on file  Stress:   . Feeling of Stress : Not on file  Social Connections:   . Frequency of Communication with Friends and Family: Not on file  . Frequency of Social Gatherings with Friends and Family: Not on file  . Attends Religious Services: Not on file  . Active Member of Clubs or Organizations: Not on file  . Attends Archivist Meetings: Not on file  . Marital Status: Not on file  Intimate Partner Violence:   . Fear of Current or Ex-Partner: Not on  file  . Emotionally Abused: Not on file  . Physically Abused: Not on file  . Sexually Abused: Not on file     PHYSICAL EXAM:  VS: BP (!) 150/96   Pulse 73   Ht 5\' 2"  (1.575 m)   Wt 155 lb (70.3 kg)   BMI 28.35 kg/m  Physical Exam Gen: NAD, alert, cooperative with exam, well-appearing MSK:  Back/left hip and leg: Some tenderness to palpation of the greater trochanter. Normal strength resistance with hip flexion. Normal back flexion and extension. Normal plantar flexion dorsiflexion. No signs of atrophy. Positive straight leg raise. Right and left hand: No signs of atrophy. Normal grip strength. Normal finger abduction and adduction strength resistance Negative Finkelstein's test. Neurovascularly intact    ASSESSMENT & PLAN:   Bilateral carpal tunnel syndrome Symptoms seem more related to carpal tunnel.  She does get improvement on the right with the brace that she  uses at night. -Splint for left today. -Counseled on home exercise therapy and supportive care. -Could consider injection physical therapy.  Piriformis syndrome of left side May be related to the sitting and all of the different movements she has to do for taking care of her daughter and her husband.  May have a component of the hip joint itself as she did have some groin pain. -IM Toradol. -Prednisone. -Flexeril. -Provided samples of Duexis. -Counseled on home exercise therapy and supportive care. -Could consider imaging of the hip or back.  Could consider physical therapy

## 2019-07-13 NOTE — Patient Instructions (Signed)
Nice to meet you Please try heat  Please try the exercises  Please try the duexis after the prednisone if needed  Please try the muscle relaxers. They can make you sleepy so try at night.  Please try the wrist brace at night.   You could ask your employer about a standing desk.  Please try voltaren over the counter on the wrists Please send me a message in MyChart with any questions or updates.  Please see me back in 4 weeks or sooner if needed.   --Dr. Raeford Razor

## 2019-07-13 NOTE — Assessment & Plan Note (Signed)
May be related to the sitting and all of the different movements she has to do for taking care of her daughter and her husband.  May have a component of the hip joint itself as she did have some groin pain. -IM Toradol. -Prednisone. -Flexeril. -Provided samples of Duexis. -Counseled on home exercise therapy and supportive care. -Could consider imaging of the hip or back.  Could consider physical therapy

## 2019-07-13 NOTE — Assessment & Plan Note (Signed)
Symptoms seem more related to carpal tunnel.  She does get improvement on the right with the brace that she uses at night. -Splint for left today. -Counseled on home exercise therapy and supportive care. -Could consider injection physical therapy.

## 2019-07-13 NOTE — Progress Notes (Signed)
Medication Samples have been provided to the patient.  Drug name: Duexis       Strength: 800mg /26.6mg         Qty: 2 Boxes  [id number]  Exp.Date: 05/2020  Dosing instructions: Take 1 tablet by mouth three (3) times a day.  The patient has been instructed regarding the correct time, dose, and frequency of taking this medication, including desired effects and most common side effects.   Sherrie George, MA 10:45 AM 07/13/2019

## 2019-08-10 ENCOUNTER — Ambulatory Visit (INDEPENDENT_AMBULATORY_CARE_PROVIDER_SITE_OTHER): Payer: No Typology Code available for payment source | Admitting: Family Medicine

## 2019-08-10 ENCOUNTER — Encounter: Payer: Self-pay | Admitting: Family Medicine

## 2019-08-10 ENCOUNTER — Other Ambulatory Visit: Payer: Self-pay

## 2019-08-10 ENCOUNTER — Ambulatory Visit (HOSPITAL_BASED_OUTPATIENT_CLINIC_OR_DEPARTMENT_OTHER)
Admission: RE | Admit: 2019-08-10 | Discharge: 2019-08-10 | Disposition: A | Discharge status: Home / Self Care | Payer: No Typology Code available for payment source | Source: Ambulatory Visit | Attending: Family Medicine | Admitting: Family Medicine

## 2019-08-10 VITALS — BP 128/81 | HR 71 | Ht 62.0 in | Wt 155.0 lb

## 2019-08-10 DIAGNOSIS — G5702 Lesion of sciatic nerve, left lower limb: Secondary | ICD-10-CM | POA: Diagnosis present

## 2019-08-10 DIAGNOSIS — G5603 Carpal tunnel syndrome, bilateral upper limbs: Secondary | ICD-10-CM | POA: Diagnosis not present

## 2019-08-10 NOTE — Progress Notes (Signed)
Amanda Villanueva - 54 y.o. female MRN [id number]  Date of birth: 04-09-1966  SUBJECTIVE:  Including CC & ROS.  Chief Complaint  Patient presents with  . Follow-up    follow up for back    Amanda Villanueva is a 54 y.o. female that is presenting with carpal tunnel and low back pain. The pain is ongoing will go from her low back to her shoulder. Seems to be intermittent. Worse with certain movements. Having carpal tunnel type symptoms in left hand.    Review of Systems See HPI   HISTORY: Past Medical, Surgical, Social, and Family History Reviewed & Updated per EMR.   Pertinent Historical Findings include:  Past Medical History:  Diagnosis Date  . Asthma     Past Surgical History:  Procedure Laterality Date  . KNEE SURGERY     bilateral  . TUBAL LIGATION      Family History  Problem Relation Age of Onset  . Stroke Mother   . Allergic rhinitis Father   . Pneumonia Daughter     Social History   Socioeconomic History  . Marital status: Single    Spouse name: Not on file  . Number of children: Not on file  . Years of education: Not on file  . Highest education level: Not on file  Occupational History  . Not on file  Tobacco Use  . Smoking status: Former Smoker    Quit date: 09/12/2012    Years since quitting: 6.9  . Smokeless tobacco: Never Used  Substance and Sexual Activity  . Alcohol use: Yes  . Drug use: Yes    Types: Marijuana  . Sexual activity: Yes    Birth control/protection: Surgical  Other Topics Concern  . Not on file  Social History Narrative  . Not on file   Social Determinants of Health   Financial Resource Strain:   . Difficulty of Paying Living Expenses:   Food Insecurity:   . Worried About Charity fundraiser in the Last Year:   . Arboriculturist in the Last Year:   Transportation Needs:   . Film/video editor (Medical):   Marland Kitchen Lack of Transportation (Non-Medical):   Physical Activity:   . Days of Exercise per Week:   . Minutes of  Exercise per Session:   Stress:   . Feeling of Stress :   Social Connections:   . Frequency of Communication with Friends and Family:   . Frequency of Social Gatherings with Friends and Family:   . Attends Religious Services:   . Active Member of Clubs or Organizations:   . Attends Archivist Meetings:   Marland Kitchen Marital Status:   Intimate Partner Violence:   . Fear of Current or Ex-Partner:   . Emotionally Abused:   Marland Kitchen Physically Abused:   . Sexually Abused:      PHYSICAL EXAM:  VS: BP 128/81   Pulse 71   Ht 5\' 2"  (1.575 m)   Wt 155 lb (70.3 kg)   BMI 28.35 kg/m  Physical Exam Gen: NAD, alert, cooperative with exam, well-appearing MSK:  Back:  No tenderness to palpation midline spine. Symptoms palpation of the paraspinal muscles in the neck. Normal range of motion. Normal strength resistance. Neurovascular intact     ASSESSMENT & PLAN:   Bilateral carpal tunnel syndrome Intermittent in nature.  Left seems to be worse than her right.  Is wearing a brace. -Counseled on home exercise therapy and supportive care. -Referral to physical therapy.  Piriformis syndrome of left side Having component of associated low back pain as opposed to possible piriformis as well.  Seems to have dysfunction and may be related to her posterior kinetic chain as to why she is having pain in the upper back. -Counseled on home exercise therapy and supportive care. -Referral to physical therapy. -X-ray

## 2019-08-10 NOTE — Patient Instructions (Signed)
Good to see you Physical therapy will give you a call.  I will call with the results from today   Please send me a message in MyChart with any questions or updates.  Please see me back in 4-6 weeks.   --Dr. Raeford Razor

## 2019-08-11 ENCOUNTER — Telehealth: Payer: Self-pay | Admitting: Family Medicine

## 2019-08-11 NOTE — Assessment & Plan Note (Signed)
Intermittent in nature.  Left seems to be worse than her right.  Is wearing a brace. -Counseled on home exercise therapy and supportive care. -Referral to physical therapy.

## 2019-08-11 NOTE — Assessment & Plan Note (Signed)
Having component of associated low back pain as opposed to possible piriformis as well.  Seems to have dysfunction and may be related to her posterior kinetic chain as to why she is having pain in the upper back. -Counseled on home exercise therapy and supportive care. -Referral to physical therapy. -X-ray

## 2019-08-11 NOTE — Telephone Encounter (Signed)
Left VM for patient. If she calls back please have her speak with a nurse/CMA and inform that her xray shows mild degenerative changes in the lower back. This can be a normal finding and it could be causing some spasm that she is experiencing the pain. Physical therapy can help with this.   If any questions then please take the best time and phone number to call and I will try to call her back.   Rosemarie Ax, MD [facility] 08/11/2019, 9:10 AM

## 2019-09-21 ENCOUNTER — Encounter: Payer: Self-pay | Admitting: Family Medicine

## 2019-09-21 ENCOUNTER — Other Ambulatory Visit: Payer: Self-pay

## 2019-09-21 ENCOUNTER — Ambulatory Visit (INDEPENDENT_AMBULATORY_CARE_PROVIDER_SITE_OTHER): Payer: No Typology Code available for payment source | Admitting: Family Medicine

## 2019-09-21 DIAGNOSIS — M545 Low back pain, unspecified: Secondary | ICD-10-CM | POA: Insufficient documentation

## 2019-09-21 DIAGNOSIS — M5137 Other intervertebral disc degeneration, lumbosacral region: Secondary | ICD-10-CM | POA: Insufficient documentation

## 2019-09-21 DIAGNOSIS — M51379 Other intervertebral disc degeneration, lumbosacral region without mention of lumbar back pain or lower extremity pain: Secondary | ICD-10-CM | POA: Insufficient documentation

## 2019-09-21 MED ORDER — CYCLOBENZAPRINE HCL 10 MG PO TABS: 10.0000 mg | ORAL_TABLET | Freq: Three times a day (TID) | ORAL | 1 refills | Status: DC | PRN

## 2019-09-21 NOTE — Assessment & Plan Note (Signed)
Pain seems to be more localized to lower back.  There are x-rays showing degenerative changes.  The SI joint was tender still possible as a contribution as well.  She was able to do physical therapy but other things popped up that took away her time. -Counseled on home exercise therapy and supportive care. -Flexeril. -Provided Duexis samples. -Could consider SI joint injection.

## 2019-09-21 NOTE — Progress Notes (Signed)
Medication Samples have been provided to the patient.  Drug name: Duexis       Strength: 800mg /26.76mL        Qty: 2 Boxes  [id number]  Exp.Date: 06/2020  Dosing instructions: Take 1 tablet by mouth three (3) times a day.  The patient has been instructed regarding the correct time, dose, and frequency of taking this medication, including desired effects and most common side effects.   Sherrie George, MA 2:33 PM 09/21/2019

## 2019-09-21 NOTE — Progress Notes (Signed)
Amanda Villanueva - 54 y.o. female MRN [id number]  Date of birth: [birth year]  SUBJECTIVE:  Including CC & ROS.  Chief Complaint  Patient presents with  . Follow-up    left-sided low back    Amanda Villanueva is a 54 y.o. female that is following up for her piriformis syndrome as well as low back pain on the left side.  It seems to be intermittent in nature.  She does get improvement with the Flexeril.  There is certain triggers that seem to initiated but she does not always know what they are.  Denies list of the radicular symptoms at this time.   Review of Systems See HPI   HISTORY: Past Medical, Surgical, Social, and Family History Reviewed & Updated per EMR.   Pertinent Historical Findings include:  Past Medical History:  Diagnosis Date  . Asthma     Past Surgical History:  Procedure Laterality Date  . KNEE SURGERY     bilateral  . TUBAL LIGATION      Family History  Problem Relation Age of Onset  . Stroke Mother   . Allergic rhinitis Father   . Pneumonia Daughter     Social History   Socioeconomic History  . Marital status: Single    Spouse name: Not on file  . Number of children: Not on file  . Years of education: Not on file  . Highest education level: Not on file  Occupational History  . Not on file  Tobacco Use  . Smoking status: Former Smoker    Quit date: 09/12/2012    Years since quitting: 7.0  . Smokeless tobacco: Never Used  Substance and Sexual Activity  . Alcohol use: Yes  . Drug use: Yes    Types: Marijuana  . Sexual activity: Yes    Birth control/protection: Surgical  Other Topics Concern  . Not on file  Social History Narrative  . Not on file   Social Determinants of Health   Financial Resource Strain:   . Difficulty of Paying Living Expenses:   Food Insecurity:   . Worried About Charity fundraiser in the Last Year:   . Arboriculturist in the Last Year:   Transportation Needs:   . Film/video editor (Medical):   Marland Kitchen Lack of  Transportation (Non-Medical):   Physical Activity:   . Days of Exercise per Week:   . Minutes of Exercise per Session:   Stress:   . Feeling of Stress :   Social Connections:   . Frequency of Communication with Friends and Family:   . Frequency of Social Gatherings with Friends and Family:   . Attends Religious Services:   . Active Member of Clubs or Organizations:   . Attends Archivist Meetings:   Marland Kitchen Marital Status:   Intimate Partner Violence:   . Fear of Current or Ex-Partner:   . Emotionally Abused:   Marland Kitchen Physically Abused:   . Sexually Abused:      PHYSICAL EXAM:  VS: BP 107/71   Pulse 69   Ht 5\' 2"  (1.575 m)   Wt 155 lb (70.3 kg)   BMI 28.35 kg/m  Physical Exam Gen: NAD, alert, cooperative with exam, well-appearing MSK:  Back: Normal internal and external rotation of the hips. No tenderness to palpation over the greater trochanter. Tenderness to palpation of the SI joint. Normal strength resistance. Neurovascular intact     ASSESSMENT & PLAN:   Low back pain Pain seems to be more localized  to lower back.  There are x-rays showing degenerative changes.  The SI joint was tender still possible as a contribution as well.  She was able to do physical therapy but other things popped up that took away her time. -Counseled on home exercise therapy and supportive care. -Flexeril. -Provided Duexis samples. -Could consider SI joint injection.

## 2019-09-21 NOTE — Patient Instructions (Signed)
Good to see you Please continue the exercises  Please try heat  Please try the duexis as needed  Please use the flexeril as needed  Please send me a message in MyChart with any questions or updates.  Please see me back in 4-6 weeks or sooner if the pain is worse .   --Dr. Raeford Razor

## 2019-09-30 ENCOUNTER — Other Ambulatory Visit: Payer: Self-pay

## 2019-09-30 ENCOUNTER — Encounter: Payer: Self-pay | Admitting: Family Medicine

## 2019-09-30 ENCOUNTER — Ambulatory Visit (INDEPENDENT_AMBULATORY_CARE_PROVIDER_SITE_OTHER): Payer: No Typology Code available for payment source | Admitting: Family Medicine

## 2019-09-30 ENCOUNTER — Telehealth: Payer: Self-pay | Admitting: Family Medicine

## 2019-09-30 VITALS — BP 133/78 | HR 71 | Ht 62.0 in | Wt 155.0 lb

## 2019-09-30 DIAGNOSIS — M7989 Other specified soft tissue disorders: Secondary | ICD-10-CM

## 2019-09-30 DIAGNOSIS — M179 Osteoarthritis of knee, unspecified: Secondary | ICD-10-CM | POA: Insufficient documentation

## 2019-09-30 NOTE — Assessment & Plan Note (Addendum)
Has had ongoing bilateral leg swelling.  Seems more likely related to either ibuprofen or venous insufficiency.  Seems less likely to be related to blood clot or heart failure.  Could have component of sleep apnea. -Counseled on compression. -Counseled on decreasing her ibuprofen. -Urinalysis, CMP, CBC, D-dimer, BNP -May need to consider a sleep study or echo going forward.

## 2019-09-30 NOTE — Patient Instructions (Signed)
Good to see you  Please try to cut down on the ibuprofen  I will call with the results today.  Please try some form of compression on the legs in the mean time.  Please send me a message in MyChart with any questions or updates.  We will follow up as needed as we may need to send you to other doctors for work up.   --Dr. Raeford Razor

## 2019-09-30 NOTE — Telephone Encounter (Signed)
Patient requesting to speak to nurse regarding her left leg and ankle. She is experiencing swelling

## 2019-09-30 NOTE — Progress Notes (Signed)
Amanda Villanueva - 54 y.o. female MRN [id number]  Date of birth: 01/17/1966  SUBJECTIVE:  Including CC & ROS.  Chief Complaint  Patient presents with  . Leg Pain    left leg and ankle    Amanda Villanueva is a 54 y.o. female that is presenting with worsening of bilateral leg swelling.  She been seeing for her low back pain and the swelling has occurred more recently.  She has not been seen by primary doctor in some time.  She has been using ibuprofen and Duexis intermittently.  She denies shortness of breath.  No PND.  No history of blood clots.  No redness or swelling of either calf.   Review of Systems See HPI   HISTORY: Past Medical, Surgical, Social, and Family History Reviewed & Updated per EMR.   Pertinent Historical Findings include:  Past Medical History:  Diagnosis Date  . Asthma     Past Surgical History:  Procedure Laterality Date  . KNEE SURGERY     bilateral  . TUBAL LIGATION      Family History  Problem Relation Age of Onset  . Stroke Mother   . Allergic rhinitis Father   . Pneumonia Daughter     Social History   Socioeconomic History  . Marital status: Single    Spouse name: Not on file  . Number of children: Not on file  . Years of education: Not on file  . Highest education level: Not on file  Occupational History  . Not on file  Tobacco Use  . Smoking status: Former Smoker    Quit date: 09/12/2012    Years since quitting: 7.0  . Smokeless tobacco: Never Used  Substance and Sexual Activity  . Alcohol use: Yes  . Drug use: Yes    Types: Marijuana  . Sexual activity: Yes    Birth control/protection: Surgical  Other Topics Concern  . Not on file  Social History Narrative  . Not on file   Social Determinants of Health   Financial Resource Strain:   . Difficulty of Paying Living Expenses:   Food Insecurity:   . Worried About Charity fundraiser in the Last Year:   . Arboriculturist in the Last Year:   Transportation Needs:   . Lexicographer (Medical):   Marland Kitchen Lack of Transportation (Non-Medical):   Physical Activity:   . Days of Exercise per Week:   . Minutes of Exercise per Session:   Stress:   . Feeling of Stress :   Social Connections:   . Frequency of Communication with Friends and Family:   . Frequency of Social Gatherings with Friends and Family:   . Attends Religious Services:   . Active Member of Clubs or Organizations:   . Attends Archivist Meetings:   Marland Kitchen Marital Status:   Intimate Partner Violence:   . Fear of Current or Ex-Partner:   . Emotionally Abused:   Marland Kitchen Physically Abused:   . Sexually Abused:      PHYSICAL EXAM:  VS: BP 133/78   Pulse 71   Ht 5\' 2"  (1.575 m)   Wt 155 lb (70.3 kg)   BMI 28.35 kg/m  Physical Exam Gen: NAD, alert, cooperative with exam, well-appearing MSK:  Right and left leg: Mild pitting edema in each lower leg. No redness or tenderness palpation of the calf in either leg. Pedal pulses intact. Neurovascularly intact     ASSESSMENT & PLAN:   Leg swelling  Has had ongoing bilateral leg swelling.  Seems more likely related to either ibuprofen or venous insufficiency.  Seems less likely to be related to blood clot or heart failure.  Could have component of sleep apnea. -Counseled on compression. -Counseled on decreasing her ibuprofen. -Urinalysis, CMP, CBC, D-dimer, BNP -May need to consider a sleep study or echo going forward.

## 2019-09-30 NOTE — Telephone Encounter (Signed)
Spoke to patient and appointment was scheduled.

## 2019-10-01 ENCOUNTER — Encounter: Payer: Self-pay | Admitting: Family Medicine

## 2019-10-01 ENCOUNTER — Telehealth: Payer: Self-pay | Admitting: Family Medicine

## 2019-10-01 LAB — COMPREHENSIVE METABOLIC PANEL
ALT: 8 IU/L (ref 0–32)
AST: 12 IU/L (ref 0–40)
Albumin/Globulin Ratio: 1.5 (ref 1.2–2.2)
Albumin: 3.8 g/dL (ref 3.8–4.9)
Alkaline Phosphatase: 84 IU/L (ref 48–121)
BUN/Creatinine Ratio: 16 (ref 9–23)
BUN: 12 mg/dL (ref 6–24)
Bilirubin Total: <0.2 mg/dL (ref 0.0–1.2)
CO2: 22 mmol/L (ref 20–29)
Calcium: 8.8 mg/dL (ref 8.7–10.2)
Chloride: 107 mmol/L — ABNORMAL HIGH (ref 96–106)
Creatinine, Ser: 0.75 mg/dL (ref 0.57–1.00)
GFR calc Af Amer: 106 mL/min/{1.73_m2} (ref >59)
GFR calc non Af Amer: 92 mL/min/{1.73_m2} (ref >59)
Globulin, Total: 2.5 g/dL (ref 1.5–4.5)
Glucose: 72 mg/dL (ref 65–99)
Potassium: 4.7 mmol/L (ref 3.5–5.2)
Sodium: 140 mmol/L (ref 134–144)
Total Protein: 6.3 g/dL (ref 6.0–8.5)

## 2019-10-01 LAB — CBC
Hematocrit: 31.8 % — ABNORMAL LOW (ref 34.0–46.6)
Hemoglobin: 10.5 g/dL — ABNORMAL LOW (ref 11.1–15.9)
MCH: 31.2 pg (ref 26.6–33.0)
MCHC: 33 g/dL (ref 31.5–35.7)
MCV: 94 fL (ref 79–97)
Platelets: 277 10*3/uL (ref 150–450)
RBC: 3.37 x10E6/uL — ABNORMAL LOW (ref 3.77–5.28)
RDW: 14.5 % (ref 11.7–15.4)
WBC: 7.5 10*3/uL (ref 3.4–10.8)

## 2019-10-01 LAB — URINALYSIS
Bilirubin, UA: NEGATIVE
Glucose, UA: NEGATIVE
Ketones, UA: NEGATIVE
Leukocytes,UA: NEGATIVE
Nitrite, UA: NEGATIVE
Specific Gravity, UA: 1.021 (ref 1.005–1.030)
Urobilinogen, Ur: 0.2 mg/dL (ref 0.2–1.0)
pH, UA: 6 (ref 5.0–7.5)

## 2019-10-01 LAB — D-DIMER, QUANTITATIVE: D-DIMER: 0.43 mg/L FEU (ref 0.00–0.49)

## 2019-10-01 LAB — BRAIN NATRIURETIC PEPTIDE: BNP: 38.2 pg/mL (ref 0.0–100.0)

## 2019-10-01 NOTE — Telephone Encounter (Signed)
Left VM for patient. If she calls back please have her speak with a nurse/CMA and inform that her hemoglobin is low.  We will call his anemia.  This can cause some swelling sometimes.  If she has not had a colonoscopy or Cologuard performed then she should consider that.  It is recommended at having this done at 54 years old.  She would need to have this done by primary physician.  Otherwise her lab work is reassuring at this time.  She could consider compression stockings.   If any questions then please take the best time and phone number to call and I will try to call her back.   Rosemarie Ax, MD [facility] 10/01/2019, 12:12 PM

## 2019-10-19 ENCOUNTER — Ambulatory Visit: Payer: No Typology Code available for payment source | Admitting: Family Medicine

## 2020-07-25 DIAGNOSIS — J45909 Unspecified asthma, uncomplicated: Secondary | ICD-10-CM | POA: Insufficient documentation

## 2021-09-15 DIAGNOSIS — M79604 Pain in right leg: Secondary | ICD-10-CM | POA: Diagnosis not present

## 2021-09-15 DIAGNOSIS — M79605 Pain in left leg: Secondary | ICD-10-CM | POA: Diagnosis not present

## 2021-09-17 ENCOUNTER — Ambulatory Visit (HOSPITAL_BASED_OUTPATIENT_CLINIC_OR_DEPARTMENT_OTHER)
Admission: RE | Admit: 2021-09-17 | Discharge: 2021-09-17 | Disposition: A | Discharge status: Home / Self Care | Payer: BC Managed Care – PPO | Source: Ambulatory Visit | Attending: Family Medicine | Admitting: Family Medicine

## 2021-09-17 ENCOUNTER — Ambulatory Visit (INDEPENDENT_AMBULATORY_CARE_PROVIDER_SITE_OTHER): Payer: BC Managed Care – PPO | Admitting: Family Medicine

## 2021-09-17 ENCOUNTER — Ambulatory Visit: Payer: Self-pay

## 2021-09-17 VITALS — BP 124/72 | Ht 62.0 in | Wt 145.0 lb

## 2021-09-17 DIAGNOSIS — M7989 Other specified soft tissue disorders: Secondary | ICD-10-CM

## 2021-09-17 DIAGNOSIS — M7121 Synovial cyst of popliteal space [Baker], right knee: Secondary | ICD-10-CM | POA: Diagnosis not present

## 2021-09-17 MED ORDER — HYDROCODONE-ACETAMINOPHEN 5-325 MG PO TABS: 1.0000 | ORAL_TABLET | Freq: Three times a day (TID) | ORAL | 0 refills | Status: DC | PRN

## 2021-09-17 MED ORDER — RIVAROXABAN (XARELTO) VTE STARTER PACK (15 & 20 MG): ORAL_TABLET | ORAL | 0 refills | Status: DC

## 2021-09-17 NOTE — Assessment & Plan Note (Signed)
Acutely occurring.  Having severe pain that extends from the mid thigh down to the foot.  Having swelling in this range with no streaking but does have redness.  Concern would be for clot that is in the pelvis as to be stocking-like distribution of this pain redness and swelling.  Does have joint effusions but these seem not associated with the severe pain that she exhibits on exam today.  Has had recent travel by air and was on estrogen patches ?-Counseled on home exercise therapy and supportive care. ?-Repeat DVT scan of right leg.  If this scan is negative would consider a vascular study with a CT into the pelvis. ?-Norco. ?-Start Xarelto. ? ?

## 2021-09-17 NOTE — Progress Notes (Signed)
?  Amanda Villanueva - 56 y.o. female MRN [id number]  Date of birth: 21-May-1965 ? ?SUBJECTIVE:  Including CC & ROS.  ?No chief complaint on file. ? ? ?Amanda Villanueva is a 56 y.o. female that is presenting with acute severe right leg swelling.  The pain and swelling start just above the patella and extend distally.  She was recently placed on estrogen patches and had recent travel.  Her swelling improves when she lies flat and becomes more severe when she starts walking again.  The pain is severe in nature.. ? ?Review of the lower extremity duplex on 5/13 shows no DVT. ? ? ?Review of Systems ?See HPI  ? ?HISTORY: Past Medical, Surgical, Social, and Family History Reviewed & Updated per EMR.   ?Pertinent Historical Findings include: ? ?Past Medical History:  ?Diagnosis Date  ? Asthma   ? ? ?Past Surgical History:  ?Procedure Laterality Date  ? KNEE SURGERY    ? bilateral  ? TUBAL LIGATION    ? ? ? ?PHYSICAL EXAM:  ?VS: BP 124/72   Ht '5\' 2"'$  (1.575 m)   Wt 145 lb (65.8 kg)   BMI 26.52 kg/m?  ?Physical Exam ?Gen: NAD, alert, cooperative with exam, well-appearing ?MSK:  ?Neurovascularly intact   ? ?Limited ultrasound: Right leg: ? ?Moderate effusion suprapatellar pouch. ?Soft tissue swelling noticed at the distal lower leg. ?No effusion within the joint space. ?Effusion noted within the first MTP joint. ?Soft tissue swelling appreciated in the foot. ? ?Summary: Nonspecific soft tissue swelling with effusion is noticed in the first MTP joint and suprapatellar pouch ? ?Ultrasound and interpretation by Clearance Coots, MD ? ? ? ? ? ? ?ASSESSMENT & PLAN:  ? ?Leg swelling ?Acutely occurring.  Having severe pain that extends from the mid thigh down to the foot.  Having swelling in this range with no streaking but does have redness.  Concern would be for clot that is in the pelvis as to be stocking-like distribution of this pain redness and swelling.  Does have joint effusions but these seem not associated with the severe pain that  she exhibits on exam today.  Has had recent travel by air and was on estrogen patches ?-Counseled on home exercise therapy and supportive care. ?-Repeat DVT scan of right leg.  If this scan is negative would consider a vascular study with a CT into the pelvis. ?-Norco. ?-Start Xarelto. ? ? ? ? ?

## 2021-09-17 NOTE — Patient Instructions (Addendum)
Good to see you ?Please keep the leg elevated  ?We will try to get the ultrasound of blood of blood clot checked today. ?We may need to get a CT scan to check higher up ?I have sent in Xarelto and pain medicine. ?Please send me a message in MyChart with any questions or updates.  ?Please see me back in a few days.  ? ?--Dr. Raeford Razor ? ?

## 2021-09-18 ENCOUNTER — Telehealth: Payer: Self-pay | Admitting: Family Medicine

## 2021-09-18 DIAGNOSIS — R112 Nausea with vomiting, unspecified: Secondary | ICD-10-CM

## 2021-09-18 DIAGNOSIS — M7989 Other specified soft tissue disorders: Secondary | ICD-10-CM

## 2021-09-18 NOTE — Telephone Encounter (Signed)
Informed of results. Will continue with CT ab/pelvis with IV contrast to evaluate for possible clot in iliac vein.  ? ?Rosemarie Ax, MD ?[facility] ?09/18/2021, 8:36 AM ? ?

## 2021-09-19 ENCOUNTER — Encounter: Payer: Self-pay | Admitting: Family Medicine

## 2021-09-19 ENCOUNTER — Encounter (HOSPITAL_BASED_OUTPATIENT_CLINIC_OR_DEPARTMENT_OTHER): Payer: Self-pay

## 2021-09-19 ENCOUNTER — Ambulatory Visit (HOSPITAL_BASED_OUTPATIENT_CLINIC_OR_DEPARTMENT_OTHER)
Admission: RE | Admit: 2021-09-19 | Discharge: 2021-09-19 | Disposition: A | Discharge status: Home / Self Care | Payer: BC Managed Care – PPO | Source: Ambulatory Visit | Attending: Family Medicine | Admitting: Family Medicine

## 2021-09-19 DIAGNOSIS — R112 Nausea with vomiting, unspecified: Secondary | ICD-10-CM | POA: Insufficient documentation

## 2021-09-19 DIAGNOSIS — K5730 Diverticulosis of large intestine without perforation or abscess without bleeding: Secondary | ICD-10-CM | POA: Diagnosis not present

## 2021-09-19 DIAGNOSIS — M7989 Other specified soft tissue disorders: Secondary | ICD-10-CM | POA: Diagnosis not present

## 2021-09-19 DIAGNOSIS — I700 Atherosclerosis of aorta: Secondary | ICD-10-CM | POA: Diagnosis not present

## 2021-09-19 DIAGNOSIS — K7689 Other specified diseases of liver: Secondary | ICD-10-CM | POA: Diagnosis not present

## 2021-09-19 MED ORDER — IOHEXOL 300 MG/ML  SOLN
100.0000 mL | Freq: Once | INTRAMUSCULAR | Status: AC | PRN
  Administered 2021-09-19: 100 mL via INTRAVENOUS

## 2021-09-21 ENCOUNTER — Telehealth (INDEPENDENT_AMBULATORY_CARE_PROVIDER_SITE_OTHER): Payer: BC Managed Care – PPO | Admitting: Family Medicine

## 2021-09-21 ENCOUNTER — Encounter: Payer: Self-pay | Admitting: Family Medicine

## 2021-09-21 DIAGNOSIS — K5790 Diverticulosis of intestine, part unspecified, without perforation or abscess without bleeding: Secondary | ICD-10-CM

## 2021-09-21 DIAGNOSIS — M5137 Other intervertebral disc degeneration, lumbosacral region: Secondary | ICD-10-CM | POA: Diagnosis not present

## 2021-09-21 DIAGNOSIS — M1711 Unilateral primary osteoarthritis, right knee: Secondary | ICD-10-CM | POA: Diagnosis not present

## 2021-09-21 DIAGNOSIS — D1803 Hemangioma of intra-abdominal structures: Secondary | ICD-10-CM | POA: Diagnosis not present

## 2021-09-21 DIAGNOSIS — M51379 Other intervertebral disc degeneration, lumbosacral region without mention of lumbar back pain or lower extremity pain: Secondary | ICD-10-CM

## 2021-09-21 NOTE — Assessment & Plan Note (Signed)
This was observed on the CT scan.  Currently does not have any symptoms. -Monitor for now.

## 2021-09-21 NOTE — Assessment & Plan Note (Signed)
There was no clot appreciated within the groin or within repeat testing of the right leg.  Appears the swelling does emanate from the Baker's cyst and swelling surrounding the knee.  The knee pain has improved during this time. -Counseled on home exercise therapy and supportive care. -Could consider aspiration injection.

## 2021-09-21 NOTE — Progress Notes (Signed)
Virtual Visit via Video Note  I connected with Amanda Villanueva on 09/21/21 at  8:00 AM EDT by a video enabled telemedicine application and verified that I am speaking with the correct person using two identifiers.  Location: Patient: home Provider: office   I discussed the limitations of evaluation and management by telemedicine and the availability of in person appointments. The patient expressed understanding and agreed to proceed.  History of Present Illness:  Amanda Villanueva is a 56 year old female that is following up after the CT scan of her abdomen and pelvis.  This was not demonstrating a clot.  This was demonstrating arthrosclerosis diverticulosis worsening degenerative disc disease lower lumbar spine and what appears to be a benign lesion at L1.   Observations/Objective:   Assessment and Plan:  Diverticulosis: This was observed on the CT scan.  Currently does not have any symptoms. -Monitor for now.  Osteoarthritis of right knee: There was no clot appreciated within the groin or within repeat testing of the right leg.  Appears the swelling does emanate from the Baker's cyst and swelling surrounding the knee.  The knee pain has improved during this time. -Counseled on home exercise therapy and supportive care. -Could consider aspiration injection.  Degenerative disc disease at L5-S1.: This was appreciated be worsening on CT scan.  Has a history of recurrent back pain. -Counseled on home exercise therapy and supportive care. -Could consider physical therapy.  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  Does not appear to be malignant in process. -We will pursue MRI going forward   Follow Up Instructions:    I discussed the assessment and treatment plan with the patient. The patient was provided an opportunity to ask questions and all were answered. The patient agreed with the plan and demonstrated an understanding of the instructions.   The patient was advised to  call back or seek an in-person evaluation if the symptoms worsen or if the condition fails to improve as anticipated.    Clearance Coots, MD

## 2021-09-21 NOTE — Assessment & Plan Note (Signed)
This was noticed near the L1 vertebra.  Does not appear to be malignant in process. -We will pursue MRI going forward

## 2021-09-21 NOTE — Assessment & Plan Note (Signed)
This was appreciated be worsening on CT scan.  Has a history of recurrent back pain. -Counseled on home exercise therapy and supportive care. -Could consider physical therapy.

## 2021-09-26 DIAGNOSIS — R0602 Shortness of breath: Secondary | ICD-10-CM | POA: Diagnosis not present

## 2021-09-26 DIAGNOSIS — R079 Chest pain, unspecified: Secondary | ICD-10-CM | POA: Diagnosis not present

## 2021-09-26 DIAGNOSIS — R5383 Other fatigue: Secondary | ICD-10-CM | POA: Diagnosis not present

## 2021-09-26 DIAGNOSIS — R030 Elevated blood-pressure reading, without diagnosis of hypertension: Secondary | ICD-10-CM | POA: Diagnosis not present

## 2021-09-26 DIAGNOSIS — I700 Atherosclerosis of aorta: Secondary | ICD-10-CM | POA: Diagnosis not present

## 2021-10-05 ENCOUNTER — Encounter: Payer: Self-pay | Admitting: Family Medicine

## 2021-10-08 ENCOUNTER — Other Ambulatory Visit: Payer: Self-pay | Admitting: Family Medicine

## 2021-10-08 MED ORDER — CYCLOBENZAPRINE HCL 10 MG PO TABS: 10.0000 mg | ORAL_TABLET | Freq: Three times a day (TID) | ORAL | 1 refills | Status: DC | PRN

## 2022-01-10 ENCOUNTER — Ambulatory Visit (INDEPENDENT_AMBULATORY_CARE_PROVIDER_SITE_OTHER): Payer: Self-pay | Admitting: Family Medicine

## 2022-01-10 ENCOUNTER — Ambulatory Visit: Payer: Self-pay

## 2022-01-10 ENCOUNTER — Encounter: Payer: Self-pay | Admitting: Family Medicine

## 2022-01-10 VITALS — BP 118/72 | Ht 62.0 in | Wt 145.0 lb

## 2022-01-10 DIAGNOSIS — M67431 Ganglion, right wrist: Secondary | ICD-10-CM | POA: Insufficient documentation

## 2022-01-10 DIAGNOSIS — G5601 Carpal tunnel syndrome, right upper limb: Secondary | ICD-10-CM

## 2022-01-10 DIAGNOSIS — M25531 Pain in right wrist: Secondary | ICD-10-CM

## 2022-01-10 NOTE — Assessment & Plan Note (Signed)
Acutely occurring.  Large cyst on the ulnar aspect of the wrist. -Counseled on supportive care. -Aspiration today.

## 2022-01-10 NOTE — Assessment & Plan Note (Signed)
Acutely occurring. Measured nerve is enlarged.  -Counseled on home exercise therapy and supportive care. -Counseled on splinting. -Could consider injection.

## 2022-01-10 NOTE — Patient Instructions (Signed)
Good to see you Please continue the brace. At least at night or during work  Please try the exercises  You can try voltaren over the counter   Please send me a message in [place] with any questions or updates.  Please see me back in 3-4 weeks.   --Dr. Raeford Razor

## 2022-01-10 NOTE — Progress Notes (Signed)
  Amanda Villanueva - 56 y.o. female MRN [id number]  Date of birth: 06-Feb-1966  SUBJECTIVE:  Including CC & ROS.  No chief complaint on file.   Amanda Villanueva is a 56 y.o. female that is presenting with right hand numbness as well as a cyst on the ulnar aspect of the wrist.   Review of Systems See HPI   HISTORY: Past Medical, Surgical, Social, and Family History Reviewed & Updated per EMR.   Pertinent Historical Findings include:  Past Medical History:  Diagnosis Date   Asthma     Past Surgical History:  Procedure Laterality Date   KNEE SURGERY     bilateral   TUBAL LIGATION       PHYSICAL EXAM:  VS: BP 118/72 (BP Location: Left Arm, Patient Position: Sitting)   Ht '5\' 2"'$  (1.575 m)   Wt 145 lb (65.8 kg)   BMI 26.52 kg/m  Physical Exam Gen: NAD, alert, cooperative with exam, well-appearing MSK:  Neurovascularly intact    Limited ultrasound: Right wrist:  Complex cyst appreciated on the ulnar aspect of the wrist. The median nerve is measured to be enlarged  Summary: Ganglion cyst and carpal tunnel syndrome  Ultrasound and interpretation by Clearance Coots, MD   Aspiration/Injection Procedure Note Perina Salvaggio 26-Mar-1966  Procedure: Aspiration Indications: right wrist cyst   Procedure Details Consent: Risks of procedure as well as the alternatives and risks of each were explained to the (patient/caregiver).  Consent for procedure obtained. Time Out: Verified patient identification, verified procedure, site/side was marked, verified correct patient position, special equipment/implants available, medications/allergies/relevent history reviewed, required imaging and test results available.  Performed.  The area was cleaned with iodine and alcohol swabs.    The right ganglion cyst of the wrist was injected using 2 cc's of 1% lidocaine with a 25 1 1/2" needle.  An 18-gauge 1-1/2 incision was used to achieve aspiration.  Ultrasound was used. Images were obtained in long  views showing the injection.    Amount of Fluid Aspirated: minimal amount Character of Fluid: gelatinous Fluid was sent for: n/a  A sterile dressing was applied.  Patient did tolerate procedure well.    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 Measured nerve is enlarged.  -Counseled on home exercise therapy and supportive care. -Counseled on splinting. -Could consider injection.  Ganglion cyst of dorsum of right wrist Acutely occurring.  Large cyst on the ulnar aspect of the wrist. -Counseled on supportive care. -Aspiration today.

## 2022-08-19 ENCOUNTER — Encounter: Payer: Self-pay | Admitting: *Deleted

## 2022-09-12 ENCOUNTER — Ambulatory Visit (INDEPENDENT_AMBULATORY_CARE_PROVIDER_SITE_OTHER): Payer: Self-pay | Admitting: Family Medicine

## 2022-09-12 ENCOUNTER — Encounter: Payer: Self-pay | Admitting: Family Medicine

## 2022-09-12 VITALS — BP 118/70 | Ht 62.0 in | Wt 145.0 lb

## 2022-09-12 DIAGNOSIS — M5416 Radiculopathy, lumbar region: Secondary | ICD-10-CM | POA: Insufficient documentation

## 2022-09-12 MED ORDER — CYCLOBENZAPRINE HCL 10 MG PO TABS: 10.0000 mg | ORAL_TABLET | Freq: Three times a day (TID) | ORAL | 1 refills | Status: DC | PRN

## 2022-09-12 NOTE — Assessment & Plan Note (Signed)
Acute on chronic in nature.  Has had intermittent episodes of this radiculopathy.  Also has ongoing low back pain.  CT of the lumbar spine demonstrated degenerative changes as well as a hemangioma at L1.  She has tried greater than 6 weeks of physician directed home exercise therapy and continues to have pain. -Counseled on home exercise therapy and supportive care. -MRI of the lumbar spine to evaluate for nerve impingement and consideration of epidural use as well as evaluation of the hemangioma

## 2022-09-12 NOTE — Progress Notes (Signed)
  Amanda Villanueva - 57 y.o. female MRN [id number]  Date of birth: [birth year]  SUBJECTIVE:  Including CC & ROS.  No chief complaint on file.   Amanda Villanueva is a 57 y.o. female that is  following up for her acute on chronic low back pain and radicular symptoms.  Previous imaging has shown hemangioma.  Continues to have low back pain with right-sided radicular type pain.   Review of Systems See HPI   HISTORY: Past Medical, Surgical, Social, and Family History Reviewed & Updated per EMR.   Pertinent Historical Findings include:  Past Medical History:  Diagnosis Date   Asthma     Past Surgical History:  Procedure Laterality Date   KNEE SURGERY     bilateral   TUBAL LIGATION       PHYSICAL EXAM:  VS: BP 118/70 (BP Location: Left Arm, Patient Position: Sitting)   Ht 5\' 2"  (1.575 m)   Wt 145 lb (65.8 kg)   BMI 26.52 kg/m  Physical Exam Gen: NAD, alert, cooperative with exam, well-appearing MSK:  Back/right leg:  Limited flexion and extension. Weakness with resistance to hip flexion and extension. Diminished patellar reflex when compared to the contralateral side. Positive straight leg raise Neurovascularly intact       ASSESSMENT & PLAN:   Lumbar radiculopathy Acute on chronic in nature.  Has had intermittent episodes of this radiculopathy.  Also has ongoing low back pain.  CT of the lumbar spine demonstrated degenerative changes as well as a hemangioma at L1.  She has tried greater than 6 weeks of physician directed home exercise therapy and continues to have pain. -Counseled on home exercise therapy and supportive care. -MRI of the lumbar spine to evaluate for nerve impingement and consideration of epidural use as well as evaluation of the hemangioma

## 2022-09-29 ENCOUNTER — Emergency Department (HOSPITAL_BASED_OUTPATIENT_CLINIC_OR_DEPARTMENT_OTHER): Payer: BC Managed Care – PPO

## 2022-09-29 ENCOUNTER — Other Ambulatory Visit: Payer: Self-pay

## 2022-09-29 ENCOUNTER — Emergency Department (HOSPITAL_BASED_OUTPATIENT_CLINIC_OR_DEPARTMENT_OTHER)
Admission: EM | Admit: 2022-09-29 | Discharge: 2022-09-29 | Disposition: A | Discharge status: Home / Self Care | Payer: BC Managed Care – PPO | Attending: Emergency Medicine | Admitting: Emergency Medicine

## 2022-09-29 ENCOUNTER — Encounter (HOSPITAL_BASED_OUTPATIENT_CLINIC_OR_DEPARTMENT_OTHER): Payer: Self-pay | Admitting: Emergency Medicine

## 2022-09-29 DIAGNOSIS — Z7951 Long term (current) use of inhaled steroids: Secondary | ICD-10-CM | POA: Insufficient documentation

## 2022-09-29 DIAGNOSIS — R079 Chest pain, unspecified: Secondary | ICD-10-CM | POA: Diagnosis not present

## 2022-09-29 DIAGNOSIS — J45909 Unspecified asthma, uncomplicated: Secondary | ICD-10-CM | POA: Insufficient documentation

## 2022-09-29 DIAGNOSIS — J40 Bronchitis, not specified as acute or chronic: Secondary | ICD-10-CM | POA: Insufficient documentation

## 2022-09-29 DIAGNOSIS — R0602 Shortness of breath: Secondary | ICD-10-CM | POA: Diagnosis not present

## 2022-09-29 DIAGNOSIS — M549 Dorsalgia, unspecified: Secondary | ICD-10-CM | POA: Diagnosis not present

## 2022-09-29 DIAGNOSIS — R059 Cough, unspecified: Secondary | ICD-10-CM | POA: Diagnosis not present

## 2022-09-29 DIAGNOSIS — R051 Acute cough: Secondary | ICD-10-CM

## 2022-09-29 LAB — BASIC METABOLIC PANEL
Anion gap: 10 (ref 5–15)
BUN: 10 mg/dL (ref 6–20)
CO2: 23 mmol/L (ref 22–32)
Calcium: 8.7 mg/dL — ABNORMAL LOW (ref 8.9–10.3)
Chloride: 104 mmol/L (ref 98–111)
Creatinine, Ser: 0.71 mg/dL (ref 0.44–1.00)
GFR, Estimated: >60 mL/min (ref >60)
Glucose, Bld: 96 mg/dL (ref 70–99)
Potassium: 3.7 mmol/L (ref 3.5–5.1)
Sodium: 137 mmol/L (ref 135–145)

## 2022-09-29 LAB — CBC WITH DIFFERENTIAL/PLATELET
Abs Immature Granulocytes: 0.05 10*3/uL (ref 0.00–0.07)
Basophils Absolute: 0.1 10*3/uL (ref 0.0–0.1)
Basophils Relative: 0 %
Eosinophils Absolute: 0.4 10*3/uL (ref 0.0–0.5)
Eosinophils Relative: 3 %
HCT: 37.5 % (ref 36.0–46.0)
Hemoglobin: 12.8 g/dL (ref 12.0–15.0)
Immature Granulocytes: 0 %
Lymphocytes Relative: 18 %
Lymphs Abs: 2.7 10*3/uL (ref 0.7–4.0)
MCH: 32.2 pg (ref 26.0–34.0)
MCHC: 34.1 g/dL (ref 30.0–36.0)
MCV: 94.5 fL (ref 80.0–100.0)
Monocytes Absolute: 1.3 10*3/uL — ABNORMAL HIGH (ref 0.1–1.0)
Monocytes Relative: 8 %
Neutro Abs: 10.8 10*3/uL — ABNORMAL HIGH (ref 1.7–7.7)
Neutrophils Relative %: 71 %
Platelets: 241 10*3/uL (ref 150–400)
RBC: 3.97 MIL/uL (ref 3.87–5.11)
RDW: 12.7 % (ref 11.5–15.5)
WBC: 15.2 10*3/uL — ABNORMAL HIGH (ref 4.0–10.5)
nRBC: 0 % (ref 0.0–0.2)

## 2022-09-29 LAB — TROPONIN I (HIGH SENSITIVITY): Troponin I (High Sensitivity): <2 ng/L (ref <18)

## 2022-09-29 LAB — D-DIMER, QUANTITATIVE: D-Dimer, Quant: 0.35 ug/mL-FEU (ref 0.00–0.50)

## 2022-09-29 MED ORDER — AZITHROMYCIN 250 MG PO TABS: 250.0000 mg | ORAL_TABLET | Freq: Every day | ORAL | 0 refills | Status: DC

## 2022-09-29 MED ORDER — ACETAMINOPHEN 325 MG PO TABS
650.0000 mg | ORAL_TABLET | Freq: Once | ORAL | Status: AC
  Administered 2022-09-29: 650 mg via ORAL
  Filled 2022-09-29: qty 2

## 2022-09-29 MED ORDER — KETOROLAC TROMETHAMINE 15 MG/ML IJ SOLN
15.0000 mg | Freq: Once | INTRAMUSCULAR | Status: AC
  Administered 2022-09-29: 15 mg via INTRAVENOUS
  Filled 2022-09-29: qty 1

## 2022-09-29 MED ORDER — PREDNISONE 20 MG PO TABS: 60.0000 mg | ORAL_TABLET | Freq: Every day | ORAL | 0 refills | Status: AC

## 2022-09-29 MED ORDER — PREDNISONE 50 MG PO TABS
60.0000 mg | ORAL_TABLET | Freq: Once | ORAL | Status: AC
  Administered 2022-09-29: 60 mg via ORAL
  Filled 2022-09-29: qty 1

## 2022-09-29 MED ORDER — ALBUTEROL SULFATE (2.5 MG/3ML) 0.083% IN NEBU: 2.5000 mg | INHALATION_SOLUTION | Freq: Four times a day (QID) | RESPIRATORY_TRACT | 12 refills | Status: DC | PRN

## 2022-09-29 MED ORDER — IPRATROPIUM-ALBUTEROL 0.5-2.5 (3) MG/3ML IN SOLN
3.0000 mL | Freq: Once | RESPIRATORY_TRACT | Status: AC
  Administered 2022-09-29: 3 mL via RESPIRATORY_TRACT
  Filled 2022-09-29: qty 3

## 2022-09-29 NOTE — ED Provider Notes (Signed)
[name] Provider Note   CSN: [id number] Arrival date & time: 09/29/22  1535     History  Chief Complaint  Patient presents with   Cough   Back Pain    Amanda Villanueva is a 57 y.o. female.  Patient here with back pain and cough.  History of asthma, back issues.  Pain mostly in between her shoulder blades.  Is not really have chest pain no shortness of breath.  Concerned maybe that this is a cardiac issue.  She denies any fever, weakness, numbness, chills.  No exertional symptoms.  Denies any headache.  The history is provided by the patient.       Home Medications Prior to Admission medications   Medication Sig Start Date End Date Taking? Authorizing Provider  albuterol (PROVENTIL) (2.5 MG/3ML) 0.083% nebulizer solution Take 3 mLs (2.5 mg total) by nebulization every 6 (six) hours as needed for wheezing or shortness of breath. 09/29/22  Yes Warner Laduca, DO  azithromycin (ZITHROMAX) 250 MG tablet Take 1 tablet (250 mg total) by mouth daily. Take first 2 tablets together, then 1 every day until finished. 09/29/22  Yes Ericka Marcellus, DO  predniSONE (DELTASONE) 20 MG tablet Take 3 tablets (60 mg total) by mouth daily for 4 days. 09/29/22 10/03/22 Yes Ashe Gago, DO  cyclobenzaprine (FLEXERIL) 10 MG tablet Take 1 tablet (10 mg total) by mouth 3 (three) times daily as needed for muscle spasms. 09/12/22   Myra Rude, MD  HYDROcodone-acetaminophen (NORCO/VICODIN) 5-325 MG tablet Take 1 tablet by mouth every 8 (eight) hours as needed. 09/17/21   Myra Rude, MD  mometasone-formoterol [facility]) 200-5 MCG/ACT AERO Inhale 2 puffs into the lungs 2 (two) times daily. USE SPACER. 10/18/15   Bobbitt, Heywood Iles, MD      Allergies    Patient has no known allergies.    Review of Systems   Review of Systems  Physical Exam Updated Vital Signs BP 126/79 (BP Location: Right Arm)   Pulse 85   Temp 98.8 F (37.1 C) (Oral)   Resp 18    Ht 5\' 2"  (1.575 m)   Wt 70.3 kg   LMP 05/11/2018   SpO2 100%   BMI 28.35 kg/m  Physical Exam Vitals and nursing note reviewed.  Constitutional:      General: She is not in acute distress.    Appearance: She is well-developed. She is not ill-appearing.  HENT:     Head: Normocephalic and atraumatic.     Right Ear: Tympanic membrane normal.     Left Ear: Tympanic membrane normal.     Nose: Nose normal.     Mouth/Throat:     Mouth: Mucous membranes are moist.  Eyes:     Extraocular Movements: Extraocular movements intact.     Conjunctiva/sclera: Conjunctivae normal.     Pupils: Pupils are equal, round, and reactive to light.  Cardiovascular:     Rate and Rhythm: Normal rate and regular rhythm.     Heart sounds: No murmur heard. Pulmonary:     Effort: Pulmonary effort is normal. No respiratory distress.     Breath sounds: Wheezing present.  Abdominal:     Palpations: Abdomen is soft.     Tenderness: There is no abdominal tenderness.  Musculoskeletal:        General: No swelling.     Cervical back: Neck supple.     Comments: Tenderness to the paraspinal thoracic muscles bilaterally  Skin:  General: Skin is warm and dry.     Capillary Refill: Capillary refill takes less than 2 seconds.  Neurological:     General: No focal deficit present.     Mental Status: She is alert and oriented to person, place, and time.     Cranial Nerves: No cranial nerve deficit.     Sensory: No sensory deficit.     Motor: No weakness.     Coordination: Coordination normal.     Comments: 5+ out of 5 strength throughout, normal sensation, normal finger-nose-finger, normal speech  Psychiatric:        Mood and Affect: Mood normal.     ED Results / Procedures / Treatments   Labs (all labs ordered are listed, but only abnormal results are displayed) Labs Reviewed  CBC WITH DIFFERENTIAL/PLATELET - Abnormal; Notable for the following components:      Result Value   WBC 15.2 (*)    Neutro Abs  10.8 (*)    Monocytes Absolute 1.3 (*)    All other components within normal limits  BASIC METABOLIC PANEL - Abnormal; Notable for the following components:   Calcium 8.7 (*)    All other components within normal limits  D-DIMER, QUANTITATIVE  TROPONIN I (HIGH SENSITIVITY)    EKG None  Radiology DG Chest Portable 1 View  Result Date: 09/29/2022 CLINICAL DATA:  Cough and back pain. EXAM: PORTABLE CHEST 1 VIEW COMPARISON:  None Available. FINDINGS: Cardiomediastinal silhouette is normal. Mediastinal contours appear intact. There is no evidence of pneumothorax. Questionable streaky peribronchial opacities in the right lower lung field. A curvilinear density obscures the right hemidiaphragm. Osseous structures are without acute abnormality. Soft tissues are grossly normal. IMPRESSION: 1. Questionable streaky peribronchial opacities in the right lower lung field may represent peribronchial airspace disease. 2. A curvilinear density obscures the right hemidiaphragm. Recommend PA and lateral radiograph to exclude right pleural effusion. Electronically Signed   By: Ted Mcalpine M.D.   On: 09/29/2022 17:02    Procedures Procedures    Medications Ordered in ED Medications  ipratropium-albuterol (DUONEB) 0.5-2.5 (3) MG/3ML nebulizer solution 3 mL (3 mLs Nebulization Given 09/29/22 1631)  acetaminophen (TYLENOL) tablet 650 mg (650 mg Oral Given 09/29/22 1628)  predniSONE (DELTASONE) tablet 60 mg (60 mg Oral Given 09/29/22 1726)  ketorolac (TORADOL) 15 MG/ML injection 15 mg (15 mg Intravenous Given 09/29/22 1727)    ED Course/ Medical Decision Making/ A&P                             Medical Decision Making Amount and/or Complexity of Data Reviewed Labs: ordered. Radiology: ordered.  Risk OTC drugs. Prescription drug management.   Amanda Villanueva is here with middle/upper back pain with cough.  History of asthma.  Normal vitals.  No fever.  Differential diagnosis likely muscular  process versus COPD/asthma exacerbation.  She had a little bit of wheezing on exam.  She is tender in the mid back but will rule out ACS, PE, pneumonia, pneumothorax.  Have no concern for dissection.  EKG shows sinus rhythm.  No ischemic changes.  Will get CBC, BMP, D-dimer, troponin, chest x-ray.  Per my review interpretation labs no significant anemia, electrolyte abnormality, kidney injury or leukocytosis.  D-dimer normal.  Troponin normal.  Chest x-ray with may be some bronchitis changes.  Will treat with Z-Pak, prednisone, breathing treatments.  Discharged in good condition.  This chart was dictated using voice recognition software.  Despite best  efforts to proofread,  errors can occur which can change the documentation meaning.         Final Clinical Impression(s) / ED Diagnoses Final diagnoses:  Acute cough  Bronchitis    Rx / DC Orders ED Discharge Orders          Ordered    predniSONE (DELTASONE) 20 MG tablet  Daily        09/29/22 1740    azithromycin (ZITHROMAX) 250 MG tablet  Daily        09/29/22 1740    albuterol (PROVENTIL) (2.5 MG/3ML) 0.083% nebulizer solution  Every 6 hours PRN        09/29/22 1740              Sharlize Hoar, DO 09/29/22 1741

## 2022-09-29 NOTE — ED Triage Notes (Signed)
Pt c/o cough and back pain "between shoulder blades" x 2d

## 2022-09-29 NOTE — ED Notes (Signed)
Pt A&OX4 ambulatory at d/c with independent steady gait. Pt verbalized understanding of d/c instructions, prescriptions and follow up care. 

## 2022-12-26 ENCOUNTER — Emergency Department (HOSPITAL_BASED_OUTPATIENT_CLINIC_OR_DEPARTMENT_OTHER)
Admission: EM | Admit: 2022-12-26 | Discharge: 2022-12-26 | Disposition: A | Discharge status: Home / Self Care | Payer: BC Managed Care – PPO | Attending: Emergency Medicine | Admitting: Emergency Medicine

## 2022-12-26 ENCOUNTER — Encounter (HOSPITAL_BASED_OUTPATIENT_CLINIC_OR_DEPARTMENT_OTHER): Payer: Self-pay | Admitting: Urology

## 2022-12-26 ENCOUNTER — Other Ambulatory Visit: Payer: Self-pay

## 2022-12-26 DIAGNOSIS — X500XXA Overexertion from strenuous movement or load, initial encounter: Secondary | ICD-10-CM | POA: Diagnosis not present

## 2022-12-26 DIAGNOSIS — S161XXA Strain of muscle, fascia and tendon at neck level, initial encounter: Secondary | ICD-10-CM | POA: Insufficient documentation

## 2022-12-26 DIAGNOSIS — M542 Cervicalgia: Secondary | ICD-10-CM | POA: Diagnosis not present

## 2022-12-26 MED ORDER — HYDROMORPHONE HCL 1 MG/ML IJ SOLN
1.0000 mg | Freq: Once | INTRAMUSCULAR | Status: AC
  Administered 2022-12-26: 1 mg via INTRAMUSCULAR
  Filled 2022-12-26: qty 1

## 2022-12-26 MED ORDER — NAPROXEN 375 MG PO TABS: 375.0000 mg | ORAL_TABLET | Freq: Two times a day (BID) | ORAL | 0 refills | Status: DC

## 2022-12-26 MED ORDER — HYDROCODONE-ACETAMINOPHEN 5-325 MG PO TABS: 1.0000 | ORAL_TABLET | Freq: Four times a day (QID) | ORAL | 0 refills | Status: DC | PRN

## 2022-12-26 MED ORDER — CYCLOBENZAPRINE HCL 10 MG PO TABS: 10.0000 mg | ORAL_TABLET | Freq: Two times a day (BID) | ORAL | 0 refills | Status: DC | PRN

## 2022-12-26 MED ORDER — PREDNISONE 50 MG PO TABS: 50.0000 mg | ORAL_TABLET | Freq: Every day | ORAL | 0 refills | Status: DC

## 2022-12-26 NOTE — ED Provider Notes (Signed)
[facility] Provider Note   CSN: [id number] Arrival date & time: 12/26/22  1141     History  Chief Complaint  Patient presents with   Neck Pain    Amanda Villanueva is a 57 y.o. female.   Neck Pain    Patient has a history of degenerative disc disease.  Patient states she has been moving into another place.  Patient has been lifting and moving heavy objects.  Patient states she started having pain in her neck a couple days ago.  This morning the pain was more severe.  She has severe pain with any movements and positions.  She denies any recent falls or injuries.  Pain does radiate towards her right shoulder but also towards the left.  She is not having a headache.  No vomiting.  No fevers or chills.  Home Medications Prior to Admission medications   Medication Sig Start Date End Date Taking? Authorizing Provider  cyclobenzaprine (FLEXERIL) 10 MG tablet Take 1 tablet (10 mg total) by mouth 2 (two) times daily as needed for muscle spasms. 12/26/22  Yes Linwood Dibbles, MD  HYDROcodone-acetaminophen (NORCO/VICODIN) 5-325 MG tablet Take 1 tablet by mouth every 6 (six) hours as needed. 12/26/22  Yes Linwood Dibbles, MD  naproxen (NAPROSYN) 375 MG tablet Take 1 tablet (375 mg total) by mouth 2 (two) times daily. 12/26/22  Yes Linwood Dibbles, MD  predniSONE (DELTASONE) 50 MG tablet Take 1 tablet (50 mg total) by mouth daily. 12/26/22  Yes Linwood Dibbles, MD  albuterol (PROVENTIL) (2.5 MG/3ML) 0.083% nebulizer solution Take 3 mLs (2.5 mg total) by nebulization every 6 (six) hours as needed for wheezing or shortness of breath. 09/29/22   Curatolo, Adam, DO  azithromycin (ZITHROMAX) 250 MG tablet Take 1 tablet (250 mg total) by mouth daily. Take first 2 tablets together, then 1 every day until finished. 09/29/22   Curatolo, Adam, DO  mometasone-formoterol (DULERA) 200-5 MCG/ACT AERO Inhale 2 puffs into the lungs 2 (two) times daily. USE SPACER. 10/18/15   Bobbitt, Heywood Iles, MD      Allergies    Patient has no known allergies.    Review of Systems   Review of Systems  Musculoskeletal:  Positive for neck pain.    Physical Exam Updated Vital Signs BP (!) 141/90   Pulse 60   Temp 98.1 F (36.7 C) (Oral)   Resp 16   Ht 1.575 m (5\' 2" )   Wt 73.7 kg   LMP 05/11/2018   SpO2 97%   BMI 29.72 kg/m  Physical Exam Vitals and nursing note reviewed.  Constitutional:      General: She is not in acute distress.    Appearance: She is well-developed.  HENT:     Head: Normocephalic and atraumatic.     Right Ear: External ear normal.     Left Ear: External ear normal.  Eyes:     General: No scleral icterus.       Right eye: No discharge.        Left eye: No discharge.     Conjunctiva/sclera: Conjunctivae normal.  Neck:     Trachea: No tracheal deviation.     Comments: No erythema, no swelling, pain with range of motion Cardiovascular:     Rate and Rhythm: Normal rate.  Pulmonary:     Effort: Pulmonary effort is normal. No respiratory distress.     Breath sounds: No stridor.  Abdominal:     General: There is  no distension.  Musculoskeletal:        General: No swelling or deformity.     Cervical back: Neck supple. Tenderness present.     Comments: Normal perfusion noted bilateral upper extremities  Skin:    General: Skin is warm and dry.     Findings: No rash.  Neurological:     Mental Status: She is alert. Mental status is at baseline.     Cranial Nerves: No dysarthria or facial asymmetry.     Motor: No seizure activity.     Comments: Normal strength and sensation bilateral upper extremities and lower extremities     ED Results / Procedures / Treatments   Labs (all labs ordered are listed, but only abnormal results are displayed) Labs Reviewed - No data to display  EKG None  Radiology No results found.  Procedures Procedures    Medications Ordered in ED Medications  HYDROmorphone (DILAUDID) injection 1 mg (1 mg  Intramuscular Given 12/26/22 1202)    ED Course/ Medical Decision Making/ A&P                                 Medical Decision Making Risk Prescription drug management.   Presented to the ED with complaints of acute neck pain.  Patient recently has been doing a lot of lifting and moving.  On exam she has muscular tenderness.  She has decreased range of motion.  She is not having any fevers or systemic symptoms to suggest an acute infection meningitis.  Patient is not having any headache doubt CNS cause such as subarachnoid hemorrhage.  Patient has focal muscular tenderness in the paraspinal region suggestive of muscular spasm.  There may also be a component of radiculopathy with her complaining of some discomfort in her shoulder.  Do not feel that imaging is necessary at this time as there has been no recent falls or injuries.  Will discharge home with course of pain medication muscle relaxant and steroids.  Recommend outpatient follow up next week to be rechecked        Final Clinical Impression(s) / ED Diagnoses Final diagnoses:  Strain of neck muscle, initial encounter    Rx / DC Orders ED Discharge Orders          Ordered    HYDROcodone-acetaminophen (NORCO/VICODIN) 5-325 MG tablet  Every 6 hours PRN        12/26/22 1243    predniSONE (DELTASONE) 50 MG tablet  Daily        12/26/22 1243    cyclobenzaprine (FLEXERIL) 10 MG tablet  2 times daily PRN        12/26/22 1243    naproxen (NAPROSYN) 375 MG tablet  2 times daily        12/26/22 1243              Linwood Dibbles, MD 12/26/22 1244

## 2022-12-26 NOTE — ED Triage Notes (Signed)
Pt states right sided neck pain that started 2 days ago  now radiating down to right arm and back  States heavy lifting with moving  Took muscle relaxer this am with little relief   H/o DDD

## 2022-12-26 NOTE — Discharge Instructions (Addendum)
Take the medications as prescribed to help with the pain in your neck.  Follow-up with your spine doctor to be rechecked.  Return to the ER for fevers, headaches or other concerning symptoms

## 2022-12-30 ENCOUNTER — Ambulatory Visit: Payer: PRIVATE HEALTH INSURANCE | Admitting: Internal Medicine

## 2023-02-10 ENCOUNTER — Ambulatory Visit: Payer: BC Managed Care – PPO | Admitting: Internal Medicine

## 2023-02-10 ENCOUNTER — Encounter: Payer: Self-pay | Admitting: Internal Medicine

## 2023-02-10 VITALS — BP 120/70 | HR 69 | Temp 98.2°F | Resp 16 | Ht 62.5 in | Wt 156.0 lb

## 2023-02-10 DIAGNOSIS — H1045 Other chronic allergic conjunctivitis: Secondary | ICD-10-CM | POA: Diagnosis not present

## 2023-02-10 DIAGNOSIS — J3089 Other allergic rhinitis: Secondary | ICD-10-CM

## 2023-02-10 DIAGNOSIS — J4520 Mild intermittent asthma, uncomplicated: Secondary | ICD-10-CM

## 2023-02-10 MED ORDER — AIRSUPRA 90-80 MCG/ACT IN AERO: 2.0000 | INHALATION_SPRAY | RESPIRATORY_TRACT | 5 refills | Status: AC | PRN

## 2023-02-10 MED ORDER — MONTELUKAST SODIUM 10 MG PO TABS: 10.0000 mg | ORAL_TABLET | Freq: Every day | ORAL | 1 refills | Status: AC

## 2023-02-10 NOTE — Progress Notes (Unsigned)
New Patient Note  RE: Amanda Villanueva MRN: [id number] DOB: 1966-01-21 Date of Office Visit: 02/10/2023  Consult requested by: No ref. provider found Primary care provider: Patient, No Pcp Per  Chief Complaint: Asthma (Old new pt asthma/)  History of Present Illness: I had the pleasure of seeing Amanda Villanueva for initial evaluation at the [facility] on 02/11/2023. She is a 57 y.o. female, who is referred here by Patient, No Pcp Per for the evaluation of asthma .  History obtained from patient, chart review.  Asthma History:  -Diagnosed at age as a child .  -Current symptoms include chest tightness, cough, shortness of breath, and wheezing 0  daytime symptoms in past month, 0 nighttime awakenings in past month Using rescue inhaler not needing  -Limitations to daily activity: none - 0 ED visits, 0 UC visits and 0 oral steroids in the past year - 0 number of lifetime hospitalizations, 0 number of lifetime intubations.  - Identified Triggers:  mold and exercise - Up-to-date with Covid-19, and Flu, vaccines. - History of prior pneumonias: 2 times  - History of prior COVID-19 infection: FEB 2024 - Smoking exposure: denies  Previous Diagnostics:  - Prior PFTs or spirometry: 2017: FEV1 2.99, 108%  - Most Recent AEC (09/29/22): 400 -Most Recent Chest Imaging: CXR on (09/29/22): IMPRESSION: 1. Questionable streaky peribronchial opacities in the right lower lung field may represent peribronchial airspace disease. 2. A curvilinear density obscures the right hemidiaphragm. Recommend PA and lateral radiograph to exclude right pleural effusion.   - Today's Asthma Control Test:  .   Management:  - Previously used therapies: albuterol nebs and HFA .  - Current regimen:  - Maintenance: none  - Rescue: Albuterol 2 puffs q4-6 hrs PRN, not using  prior to exercise  Chronic rhinitis: started as a child  Symptoms include:  itchy ears, nasal congestion, rhinorrhea, post nasal  drainage, sneezing, watery eyes, itchy eyes, and itchy nose  Occurs seasonally-spring and fall  Potential triggers: pollen,  Treatments tried: benadryl at night , montelukast  Previous allergy testing:  2017:  History of reflux/heartburn: no History of chronic sinusitis or sinus surgery: no Nonallergic triggers:  denies       Assessment and Plan: Amanda Villanueva is a 57 y.o. female with: Mild intermittent asthma in adult without complication - Plan: Spirometry with Graph  Seasonal and perennial allergic rhinitis  Other chronic allergic conjunctivitis of both eyes   Plan: Patient Instructions  Mild Intermittent Asthma: well controlled  - your lung testing today looked okay and did not reverse with albuterol  - Rescue Inhaler: Airsupra 2 puffs. Use  every 4-6 hours as needed for chest tightness, wheezing, or coughing.  Can also use 15 minutes prior to exercise if you have symptoms with activity. - Asthma is not controlled if:  - Symptoms are occurring >2 times a week OR  - >2 times a month nighttime awakenings  - You are requiring systemic steroids (prednisone/steroid injections) more than once per year  - Your require hospitalization for your asthma.  - Please call the clinic to schedule a follow up if these symptoms arise  Chronic Rhinitis Seasonal and Perennial Allergic:  - Prevention:  - allergen avoidance when possible - consider allergy shots as long term control of your symptoms by teaching your immune system to be more tolerant of your allergy triggers  - Symptom control: - Start Singulair (Montelukast) 10mg  nightly.   - Discontinue if nightmares of behavior changes. -  Continue Antihistamine: daily or daily as needed.   -Options include Zyrtec (Cetirizine) 10mg , Claritin (Loratadine) 10mg , Allegra (Fexofenadine) 180mg , or Xyzal (Levocetirinze) 5mg  - Can be purchased over-the-counter if not covered by insurance.  Allergic Conjunctivitis:  - Start Allergy Eye drops-great  options include Pataday (Olopatadine) or Zaditor (ketotifen) for eye symptoms daily as needed-both sold over the counter if not covered by insurance. and Rewetting Drops such as Systane,TheraTears, etc  -Avoid eye drops that say red eye relief as they may contain medications that dry out your eyes.   Follow up: for updated allergy testing   Thank you so much for letting me partake in your care today.  Don't hesitate to reach out if you have any additional concerns!  Ferol Luz, MD  [facility] [place] [place]   Meds ordered this encounter  Medications   montelukast (SINGULAIR) 10 MG tablet    Sig: Take 1 tablet (10 mg total) by mouth at bedtime.    Dispense:  90 tablet    Refill:  1   Albuterol-Budesonide (AIRSUPRA) 90-80 MCG/ACT AERO    Sig: Inhale 2 puffs into the lungs every 4 (four) hours as needed.    Dispense:  10.7 g    Refill:  5   Lab Orders  No laboratory test(s) ordered today    Other allergy screening: Asthma: yes Rhino conjunctivitis: yes Food allergy: no Medication allergy: no Hymenoptera allergy: no Urticaria: no Eczema:no History of recurrent infections suggestive of immunodeficency: no  Diagnostics: Spirometry:  Tracings reviewed. Her effort: Good reproducible efforts. FVC: 2.35L FEV1: 2.00 L, 80% predicted FEV1/FVC ratio: 85% Interpretation: Spirometry consistent with normal pattern.  After 4 puffs of albuterol FVC decreased by 80 cc and 3%, 1 cc 1%.  Is not a significant postbronchodilator response Please see scanned spirometry results for details.   Past Medical History: Patient Active Problem List   Diagnosis Date Noted   Lumbar radiculopathy 09/12/2022   Ganglion cyst of dorsum of right wrist 01/10/2022   Diverticulosis 09/21/2021   Hemangioma of intra-abdominal structure 09/21/2021   OA (osteoarthritis) of knee 09/30/2019   Degenerative disc disease at L5-S1 level 09/21/2019   Piriformis syndrome of left side  07/13/2019   Carpal tunnel syndrome of right wrist 07/13/2019   Seasonal allergic conjunctivitis 11/30/2015   Moderate persistent asthma 10/18/2015   Seasonal and perennial allergic rhinitis 10/18/2015   Past Medical History:  Diagnosis Date   Asthma    Past Surgical History: Past Surgical History:  Procedure Laterality Date   ABDOMINAL HYSTERECTOMY     KNEE SURGERY     bilateral   TUBAL LIGATION     Medication List:  Current Outpatient Medications  Medication Sig Dispense Refill   albuterol (PROVENTIL) (2.5 MG/3ML) 0.083% nebulizer solution Take 2.5 mg by nebulization every 6 (six) hours as needed for wheezing or shortness of breath.     albuterol (VENTOLIN HFA) 108 (90 Base) MCG/ACT inhaler Inhale 2 puffs into the lungs every 4 (four) hours as needed for wheezing or shortness of breath.     Albuterol-Budesonide (AIRSUPRA) 90-80 MCG/ACT AERO Inhale 2 puffs into the lungs every 4 (four) hours as needed. 10.7 g 5   atorvastatin (LIPITOR) 20 MG tablet Take 20 mg by mouth daily.     cyclobenzaprine (FLEXERIL) 10 MG tablet Take 10 mg by mouth 3 (three) times daily as needed.     diphenhydrAMINE (BENADRYL) 25 MG tablet Take 25 mg by mouth every 6 (six) hours as needed.  montelukast (SINGULAIR) 10 MG tablet Take 1 tablet (10 mg total) by mouth at bedtime. 90 tablet 1   No current facility-administered medications for this visit.   Allergies: No Known Allergies Social History: Social History   Socioeconomic History   Marital status: Single    Spouse name: Not on file   Number of children: Not on file   Years of education: Not on file   Highest education level: Not on file  Occupational History   Not on file  Tobacco Use   Smoking status: Former    Current packs/day: 0.00    Types: Cigarettes    Quit date: 09/12/2012    Years since quitting: 10.4    Passive exposure: Past   Smokeless tobacco: Never  Vaping Use   Vaping status: Never Used  Substance and Sexual Activity    Alcohol use: Yes    Alcohol/week: 1.0 standard drink of alcohol    Types: 1 Glasses of wine per week   Drug use: Yes    Types: Marijuana   Sexual activity: Yes    Birth control/protection: Surgical  Other Topics Concern   Not on file  Social History Narrative   Not on file   Social Determinants of Health   Financial Resource Strain: Not on file  Food Insecurity: No Food Insecurity (09/26/2021)   Received from [facility], [facility]   Hunger Vital Sign    Worried About Running Out of Food in the Last Year: Never true    Ran Out of Food in the Last Year: Never true  Transportation Needs: Not on file  Physical Activity: Not on file  Stress: Not on file  Social Connections: Unknown (09/18/2021)   Received from [facility], [facility]   Social Network    Social Network: Not on file   Lives in a single-family home, wood floors throughout, gas heating, central cooling, no roaches in the house but is to get off the floor.  Dogs with access to bedroom.  No dust mite precautions.  The house or home.  She does smoke marijuana weekly.  Prior cocaine use.  Status post to fumes, chemicals or dust.  No HEPA filter in the home and [place]. 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   Objective: BP 120/70   Pulse 69   Temp 98.2 F (36.8 C) (Temporal)   Resp 16   Ht 5' 2.5" (1.588 m)   Wt 156 lb (70.8 kg)   LMP 05/11/2018   SpO2 97%   BMI 28.08 kg/m  Body mass index is 28.08 kg/m.  General Appearance:  Alert, cooperative, no distress, appears stated age  Head:  Normocephalic, without obvious abnormality, atraumatic  Eyes:  Conjunctiva clear, EOM's intact  Nose: Nares normal, normal mucosa, no visible anterior polyps, and  septum midline  Throat: Lips, tongue normal; teeth and gums normal, normal posterior oropharynx  Neck: Supple, symmetrical  Lungs:   clear to auscultation bilaterally, Respirations unlabored, no coughing  Heart:  regular rate and rhythm and no murmur, Appears well perfused  Extremities: No edema  Skin: Skin color, texture, turgor normal, no rashes or lesions on visualized portions of skin  Neurologic: No  gross deficits   The plan was reviewed with the patient/family, and all questions/concerned were addressed.  It was my pleasure to see Amanda Villanueva today and participate in her care. Please feel free to contact me with any questions or concerns.  Sincerely,  Ferol Luz, MD Allergy & Immunology  [facility] office: [phone] [facility] office: [phone]

## 2023-02-10 NOTE — Patient Instructions (Signed)
Mild Intermittent Asthma: well controlled  - your lung testing today looked okay and did not reverse with albuterol  - Rescue Inhaler: Airsupra 2 puffs. Use  every 4-6 hours as needed for chest tightness, wheezing, or coughing.  Can also use 15 minutes prior to exercise if you have symptoms with activity. - Asthma is not controlled if:  - Symptoms are occurring >2 times a week OR  - >2 times a month nighttime awakenings  - You are requiring systemic steroids (prednisone/steroid injections) more than once per year  - Your require hospitalization for your asthma.  - Please call the clinic to schedule a follow up if these symptoms arise  Chronic Rhinitis Seasonal and Perennial Allergic:  - Prevention:  - allergen avoidance when possible - consider allergy shots as long term control of your symptoms by teaching your immune system to be more tolerant of your allergy triggers  - Symptom control: - Start Singulair (Montelukast) 10mg  nightly.   - Discontinue if nightmares of behavior changes. - Continue Antihistamine: daily or daily as needed.   -Options include Zyrtec (Cetirizine) 10mg , Claritin (Loratadine) 10mg , Allegra (Fexofenadine) 180mg , or Xyzal (Levocetirinze) 5mg  - Can be purchased over-the-counter if not covered by insurance.  Allergic Conjunctivitis:  - Start Allergy Eye drops-great options include Pataday (Olopatadine) or Zaditor (ketotifen) for eye symptoms daily as needed-both sold over the counter if not covered by insurance. and Rewetting Drops such as Systane,TheraTears, etc  -Avoid eye drops that say red eye relief as they may contain medications that dry out your eyes.   Follow up: for updated allergy testing   Thank you so much for letting me partake in your care today.  Don't hesitate to reach out if you have any additional concerns!  Ferol Luz, MD  [facility] [place], [place]

## 2023-05-05 DIAGNOSIS — R030 Elevated blood-pressure reading, without diagnosis of hypertension: Secondary | ICD-10-CM | POA: Diagnosis not present

## 2023-05-05 DIAGNOSIS — Z1331 Encounter for screening for depression: Secondary | ICD-10-CM | POA: Diagnosis not present

## 2023-05-05 DIAGNOSIS — R0602 Shortness of breath: Secondary | ICD-10-CM | POA: Diagnosis not present

## 2023-05-05 DIAGNOSIS — R079 Chest pain, unspecified: Secondary | ICD-10-CM | POA: Diagnosis not present

## 2023-05-05 DIAGNOSIS — I700 Atherosclerosis of aorta: Secondary | ICD-10-CM | POA: Diagnosis not present

## 2023-07-22 ENCOUNTER — Encounter: Payer: Self-pay | Admitting: Sports Medicine

## 2023-07-22 ENCOUNTER — Ambulatory Visit (INDEPENDENT_AMBULATORY_CARE_PROVIDER_SITE_OTHER): Admitting: Sports Medicine

## 2023-07-22 VITALS — BP 114/72 | Ht 62.5 in | Wt 156.0 lb

## 2023-07-22 DIAGNOSIS — G5603 Carpal tunnel syndrome, bilateral upper limbs: Secondary | ICD-10-CM | POA: Diagnosis not present

## 2023-07-22 NOTE — Progress Notes (Signed)
   Subjective:    Patient ID: Orvilla Truett, female    DOB: [birth year], 58 y.o.   MRN: [id number]  HPI chief complaint: Bilateral hand pain  Patient is a very pleasant 58 year old right-hand-dominant female that comes in today complaining of chronic bilateral hand pain, left greater than right.  She describes it as "nerve pain".  She describes numbness and tingling diffusely through both hands.  She has tried several different nighttime splints which were initially helpful.  Most recently, she has began to have trouble with grip and has noticed some weakness in her hands.  No prior wrist or hand surgery.  She does have a history of carpal tunnel syndrome and her symptoms feel identical to that.  Past medical history reviewed Medications reviewed Allergies reviewed  Review of Systems As above    Objective:   Physical Exam  Well-developed, well-nourished.  No acute distress  Examination of both wrist show good range of motion.  Positive Tinel's and positive Phalen's bilaterally.  No thenar atrophy noted.  Good pulses.      Assessment & Plan:   Chronic bilateral hand and wrist pain secondary to carpal tunnel syndrome.  I discussed ultrasound-guided cortisone injections versus referral to hand surgery.  She would like to try the injections first.  Will arrange for these to be done in our [place] office under ultrasound guidance.  She will follow-up with me as needed.  This note was dictated using Dragon naturally speaking software and may contain errors in syntax, spelling, or content which have not been identified prior to signing this note.

## 2023-07-25 ENCOUNTER — Encounter: Payer: Self-pay | Admitting: Family Medicine

## 2023-07-25 ENCOUNTER — Other Ambulatory Visit: Payer: Self-pay

## 2023-07-25 ENCOUNTER — Ambulatory Visit (INDEPENDENT_AMBULATORY_CARE_PROVIDER_SITE_OTHER): Admitting: Family Medicine

## 2023-07-25 VITALS — BP 128/86 | Ht 62.0 in | Wt 165.0 lb

## 2023-07-25 DIAGNOSIS — G5603 Carpal tunnel syndrome, bilateral upper limbs: Secondary | ICD-10-CM

## 2023-07-25 MED ORDER — METHYLPREDNISOLONE ACETATE 40 MG/ML IJ SUSP
40.0000 mg | Freq: Once | INTRAMUSCULAR | Status: AC
  Administered 2023-07-25: 40 mg via INTRA_ARTICULAR

## 2023-07-25 NOTE — Patient Instructions (Signed)
 Today you received an injection with corticosteroid. This injection is usually done in response to pain and inflammation. There is some "numbing" medicine also in the shot so the injected area may be numb and feel really good for the next couple of hours. The numbing medicine usually wears off in 2-3 hours though, and then your pain level will be right back where it was before the injection.   The actually benefit from the steroid injection is usually noticed in 2-7 days. You may actually experience a small (as in 10%) INCREASE in pain in the first 24 hours---that is common.   Things to watch out for that you should contact us or a health care provider urgently would include: 1. Unusual (as in more than 10%) increase in pain 2. New fever > 101.5 3. New swelling or redness of the injected area.  4. Streaking of red lines around the area injected.

## 2023-07-25 NOTE — Progress Notes (Signed)
 PCP: Norm Salt, PA  Chief Complaint: Bilateral carpal tunnel Subjective:   HPI: Patient is a 58 y.o. female here for bilateral carpal tunnel syndrome.  Patient states that the right is worse than the left.  Patient dates that this has been going on for approximately 5 years now.  Patient has tried all conservative therapy including wrist splints which she has to wear nightly.  Patient types a lot and notes that her symptoms have become exacerbated over the past few weeks.  Patient recently sports medicine at [facility] who recommended bilateral carpal tunnel injections.    Past Medical History:  Diagnosis Date   Asthma     Current Outpatient Medications on File Prior to Visit  Medication Sig Dispense Refill   albuterol (PROVENTIL) (2.5 MG/3ML) 0.083% nebulizer solution Take 2.5 mg by nebulization every 6 (six) hours as needed for wheezing or shortness of breath.     albuterol (VENTOLIN HFA) 108 (90 Base) MCG/ACT inhaler Inhale 2 puffs into the lungs every 4 (four) hours as needed for wheezing or shortness of breath.     Albuterol-Budesonide (AIRSUPRA) 90-80 MCG/ACT AERO Inhale 2 puffs into the lungs every 4 (four) hours as needed. 10.7 g 5   atorvastatin (LIPITOR) 20 MG tablet Take 20 mg by mouth daily.     cyclobenzaprine (FLEXERIL) 10 MG tablet Take 10 mg by mouth 3 (three) times daily as needed.     diphenhydrAMINE (BENADRYL) 25 MG tablet Take 25 mg by mouth every 6 (six) hours as needed.     montelukast (SINGULAIR) 10 MG tablet Take 1 tablet (10 mg total) by mouth at bedtime. 90 tablet 1   No current facility-administered medications on file prior to visit.    Past Surgical History:  Procedure Laterality Date   ABDOMINAL HYSTERECTOMY     KNEE SURGERY     bilateral   TUBAL LIGATION      No Known Allergies  BP 128/86   Ht 5\' 2"  (1.575 m)   Wt 165 lb (74.8 kg)   LMP 05/11/2018   BMI 30.18 kg/m       No data to display              No data to display               Objective:  Physical Exam:  Gen: NAD, comfortable in exam room  Bilateral wrist inspection reveals no gross abnormalities.  Tinel's positive, Phalen positive bilaterally.  Mild tenderness to palpation over the retinaculum.  Decreased sensation of the first 3 digits bilaterally with the right being greater than left.  Strength is 5 out of 5.  Handgrip appropriate.  No noted thenar atrophy. Assessment & Plan:  1. Bilateral carpal tunnel syndrome (Primary) -Discussed with patient regarding risk benefits of steroid injection.  Patient would like to go ahead with bilateral carpal tunnel steroid injection at this time.  Patient tolerated the procedure well.  Did discuss with patient that given how long her symptoms have been going on, patient would probably best benefit from having an appointment with a hand surgeon to discuss possible surgery.  Patient is agreeable with this.  Will have patient off work tomorrow and can take it easy over the weekend and should continue to have benefit as time goes on from the injections.  Follow-up with hand surgery within the next 6 to 8 weeks. - [place] LIMITED JOINT SPACE STRUCTURES UP BILAT; Future - Ambulatory referral to Orthopedic Surgery  Procedure: US-guided  Carpal Tunnel Injection, Bilateral Wrist After informed written consent and discussion on R/B/I, a timeout was performed, patient was seated on exam table with the affected arm placed in supine position on table. The area overlying the patient's carpal tunnel was prepped with  alcohol swabs then utilizing sterile ultrasound gel, ultrasound guidance via an in-plane approach, the patient's carpal tunnel was injected with a mixture of 2:1 lidocaine:DepoMedrol with hydrodissection of the median nerve from the flexor retinaculum in 360 degree fashion. Visualization of the needle was achieved with a transverse, in-plane approach. Patient tolerated procedure well without immediate complications.   Brenton Grills MD, PGY-4  Sports Medicine Fellow [facility]

## 2023-08-25 ENCOUNTER — Ambulatory Visit (INDEPENDENT_AMBULATORY_CARE_PROVIDER_SITE_OTHER): Admitting: Orthopedic Surgery

## 2023-08-25 ENCOUNTER — Other Ambulatory Visit (INDEPENDENT_AMBULATORY_CARE_PROVIDER_SITE_OTHER): Payer: Self-pay

## 2023-08-25 DIAGNOSIS — G5603 Carpal tunnel syndrome, bilateral upper limbs: Secondary | ICD-10-CM

## 2023-08-25 DIAGNOSIS — G5601 Carpal tunnel syndrome, right upper limb: Secondary | ICD-10-CM

## 2023-08-25 DIAGNOSIS — M67431 Ganglion, right wrist: Secondary | ICD-10-CM | POA: Diagnosis not present

## 2023-08-25 NOTE — Progress Notes (Signed)
 Amanda Villanueva - 58 y.o. female MRN [id number]  Date of birth: 01/18/1966  Office Visit Note: Visit Date: 08/25/2023 PCP: Dianah Fort, PA Referred by: Jude Norton, MD  Subjective: No chief complaint on file.  HPI: Amanda Villanueva is a 58 y.o. female who presents today for evaluation of bilateral hand complaints.  She states that she has ongoing numbness and tingling in the bilateral hands, of both the radial and ulnar sided digits which has been progressive now over the past multiple months.  She has undergone prior treatment with braces of her wrist for nocturnal symptoms, underwent recent injections to the bilateral carpal tunnels approximately 1 month prior by sports medicine.  States that she got minimal relief from the injections.  She also has a history of a prior cyst along the ulnar border of the wrist which has been present now for multiple months, has not undergone prior intervention.  She has a history notable for prior left small finger infection status post extensive irrigation and debridement with subsequent persistent numbness of the small finger.  Surgery was performed multiple years prior.  She also has a prior history of a right index finger infection status post previous irrigation and debridement with subsequent numbness to the distal aspect of the index finger which has also been persistent.   Pertinent ROS were reviewed with the patient and found to be negative unless otherwise specified above in HPI.   Visit Reason: bilateral wrists Duration of symptoms: 6+ months Hand dominance: right Occupation:customer service rep Diabetic: No Smoking: Yes- marijuana Heart/Lung History: asthma Blood Thinners: none  Prior Testing/EMG: none Injections (Date): 07/25/23  Treatments: injection, braces Prior Surgery: none  Bilateral carpal tunnel syndrome Right wrist ganglion cyst ulnar aspect  Assessment & Plan: Visit Diagnoses:  1. Bilateral carpal tunnel syndrome    2. Ganglion cyst of wrist, right     Plan: Extensive discussion was had with the patient today about the concern for ongoing bilateral carpal tunnel syndrome that is refractory to conservative care.  Given her complex history, and symptoms persistent to recent injections, I would like to obtain bilateral electrodiagnostic studies in order to better understand potential nerve compression and severity.  Her symptoms also do involve the radial and ulnar digits, I would like to better evaluate for potential cubital tunnel compression versus potential cervical radiculopathy.  As for the cystic mass along the ulnar border of the right wrist, this may be causing some compression along the ulnar nerve at the wrist level as well, which may be contributing to numbness in the ulnar distribution in this area.  She expressed full understanding of this plan.  Will undergo electrodiagnostic testing of the bilateral upper extremities and will return to me in the near future to review results of the study and discuss appropriate next treatment steps.   Follow-up: No follow-ups on file.   Meds & Orders: No orders of the defined types were placed in this encounter.   Orders Placed This Encounter  Procedures   XR Wrist Complete Right   XR Wrist Complete Left   Ambulatory referral to Physical Medicine Rehab     Procedures: No procedures performed      Clinical History: No specialty comments available.  She reports that she quit smoking about 10 years ago. Her smoking use included cigarettes. She has been exposed to tobacco smoke. She has never used smokeless tobacco. No results for input(s): "HGBA1C", "LABURIC" in the last 8760 hours.  Objective:   Vital  Signs: LMP 05/11/2018   Physical Exam  Gen: Well-appearing, in no acute distress; non-toxic CV: Regular Rate. Well-perfused. Warm.  Resp: Breathing unlabored on room air; no wheezing. Psych: Fluid speech in conversation; appropriate affect; normal  thought process  Ortho Exam PHYSICAL EXAM:  General: Patient is well appearing and in no distress. Cervical spine mobility is full in all directions:  Skin and Muscle: Prior well healed incision along the volar aspect of the left small finger  Range of Motion and Palpation Tests: Mobility is full about the elbows with flexion and extension.  No evidence of nerve subluxation at bilateral elbow.  Forearm supination and pronation are 85/85 bilaterally.  Wrist flexion/extension is 75/65 bilaterally.  Digital flexion and extension are full.  Thumb opposition is full to the base of the small fingers bilaterally.    Palpable mass of the ulnar border of the right wrist, proximal to the wrist crease, measures approximately 1 cm x 1.5 cm, mobile, soft, compressible, positive Tinel's over the Guyon's canal region   Neurologic, Vascular, Motor: Sensation is diminished to light touch in the bilateral median and ulnar distribution.    Thenar atrophy: Negative bilaterally Tinel sign: Positive bilateral carpal tunnel Carpal tunnel compression: Positive bilateral Phalen test: Positive bilateral  Motor bilateral hand FPL: 5/5 Index FDP: 5/5 APB: 5/5  Fingers pink and well perfused.  Capillary refill is brisk.     No results found for: "HGBA1C"   Imaging: XR Wrist Complete Left Result Date: 08/25/2023 There is no evidence of fracture or dislocation. Soft tissues are unremarkable.  Calcifications present at the ulnocarpal region suggesting possible chondrocalcinosis.  XR Wrist Complete Right Result Date: 08/25/2023 There is no evidence of fracture or dislocation. Soft tissues are unremarkable.  Calcifications present at the ulnocarpal region suggesting possible chondrocalcinosis.   Past Medical/Family/Surgical/Social History: Medications & Allergies reviewed per EMR, new medications updated. Patient Active Problem List   Diagnosis Date Noted   Lumbar radiculopathy 09/12/2022   Ganglion  cyst of dorsum of right wrist 01/10/2022   Diverticulosis 09/21/2021   Hemangioma of intra-abdominal structure 09/21/2021   OA (osteoarthritis) of knee 09/30/2019   Degenerative disc disease at L5-S1 level 09/21/2019   Piriformis syndrome of left side 07/13/2019   Carpal tunnel syndrome of right wrist 07/13/2019   Seasonal allergic conjunctivitis 11/30/2015   Moderate persistent asthma 10/18/2015   Seasonal and perennial allergic rhinitis 10/18/2015   Past Medical History:  Diagnosis Date   Asthma    Family History  Problem Relation Age of Onset   Stroke Mother    Allergic rhinitis Father    Pneumonia Daughter    Multiple sclerosis Daughter    Past Surgical History:  Procedure Laterality Date   ABDOMINAL HYSTERECTOMY     KNEE SURGERY     bilateral   TUBAL LIGATION     Social History   Occupational History   Not on file  Tobacco Use   Smoking status: Former    Current packs/day: 0.00    Types: Cigarettes    Quit date: 09/12/2012    Years since quitting: 10.9    Passive exposure: Past   Smokeless tobacco: Never  Vaping Use   Vaping status: Never Used  Substance and Sexual Activity   Alcohol use: Yes    Alcohol/week: 1.0 standard drink of alcohol    Types: 1 Glasses of wine per week   Drug use: Yes    Types: Marijuana   Sexual activity: Yes    Birth control/protection: Surgical  Camerin Ladouceur Alvia Jointer, M.D. [facility], Hand Surgery

## 2023-09-05 ENCOUNTER — Ambulatory Visit (INDEPENDENT_AMBULATORY_CARE_PROVIDER_SITE_OTHER): Admitting: Physical Medicine and Rehabilitation

## 2023-09-05 DIAGNOSIS — M25532 Pain in left wrist: Secondary | ICD-10-CM

## 2023-09-05 DIAGNOSIS — R202 Paresthesia of skin: Secondary | ICD-10-CM

## 2023-09-05 DIAGNOSIS — R531 Weakness: Secondary | ICD-10-CM | POA: Diagnosis not present

## 2023-09-05 DIAGNOSIS — M25531 Pain in right wrist: Secondary | ICD-10-CM | POA: Diagnosis not present

## 2023-09-05 NOTE — Progress Notes (Unsigned)
 Amanda Villanueva - 58 y.o. female MRN [id number]  Date of birth: [birth year]  Office Visit Note: Visit Date: 09/05/2023 PCP: Dianah Fort, PA Referred by: Merrill Abide, MD  Subjective: No chief complaint on file.  HPI: Amanda Villanueva is a 58 y.o. female who comes in today at the request of Dr. Anshul Agarwala for evaluation and management of chronic, worsening and severe pain, numbness and tingling in the Bilateral upper extremities.  Patient is Right hand dominant.  evaluation of bilateral hand complaints.  She states that she has ongoing numbness and tingling in the bilateral hands, of both the radial and ulnar sided digits which has been progressive now over the past multiple months.  She has undergone prior treatment with braces of her wrist for nocturnal symptoms, underwent recent injections to the bilateral carpal tunnels approximately 1 month prior by sports medicine.  States that she got minimal relief from the injections.   She also has a history of a prior cyst along the ulnar border of the wrist which has been present now for multiple months, has not undergone prior intervention.   She has a history notable for prior left small finger infection status post extensive irrigation and debridement with subsequent persistent numbness of the small finger.  Surgery was performed multiple years prior.  She also has a prior history of a right index finger infection status post previous irrigation and debridement with subsequent numbness to the distal aspect of the index finger which has also been persistent.            ROS Otherwise per HPI.  Assessment & Plan: Visit Diagnoses:    ICD-10-CM   1. Paresthesia of skin  R20.2 NCV with EMG (electromyography)       Plan: No additional findings.   Meds & Orders: No orders of the defined types were placed in this encounter.   Orders Placed This Encounter  Procedures   NCV with EMG (electromyography)    Follow-up: No  follow-ups on file.   Procedures: No procedures performed      Clinical History: No specialty comments available.   She reports that she quit smoking about 10 years ago. Her smoking use included cigarettes. She has been exposed to tobacco smoke. She has never used smokeless tobacco. No results for input(s): "HGBA1C", "LABURIC" in the last 8760 hours.  Objective:  VS:  HT:    WT:   BMI:     BP:   HR: bpm  TEMP: ( )  RESP:  Physical Exam  Ortho Exam  Imaging: No results found.  Past Medical/Family/Surgical/Social History: Medications & Allergies reviewed per EMR, new medications updated. Patient Active Problem List   Diagnosis Date Noted   Lumbar radiculopathy 09/12/2022   Ganglion cyst of dorsum of right wrist 01/10/2022   Diverticulosis 09/21/2021   Hemangioma of intra-abdominal structure 09/21/2021   OA (osteoarthritis) of knee 09/30/2019   Degenerative disc disease at L5-S1 level 09/21/2019   Piriformis syndrome of left side 07/13/2019   Carpal tunnel syndrome of right wrist 07/13/2019   Seasonal allergic conjunctivitis 11/30/2015   Moderate persistent asthma 10/18/2015   Seasonal and perennial allergic rhinitis 10/18/2015   Past Medical History:  Diagnosis Date   Asthma    Family History  Problem Relation Age of Onset   Stroke Mother    Allergic rhinitis Father    Pneumonia Daughter    Multiple sclerosis Daughter    Past Surgical History:  Procedure Laterality Date   ABDOMINAL HYSTERECTOMY  KNEE SURGERY     bilateral   TUBAL LIGATION     Social History   Occupational History   Not on file  Tobacco Use   Smoking status: Former    Current packs/day: 0.00    Types: Cigarettes    Quit date: 09/12/2012    Years since quitting: 10.9    Passive exposure: Past   Smokeless tobacco: Never  Vaping Use   Vaping status: Never Used  Substance and Sexual Activity   Alcohol use: Yes    Alcohol/week: 1.0 standard drink of alcohol    Types: 1 Glasses of  wine per week   Drug use: Yes    Types: Marijuana   Sexual activity: Yes    Birth control/protection: Surgical

## 2023-09-05 NOTE — Progress Notes (Unsigned)
 senPain Scale   Average Pain 4 Patient states she is Right Hand Dominate. Patient states she has Chronic numbness and tingling bilaterally in hands. Patient also stats she has tingling in her left forearm at times.        +Driver, -BT, -Dye Allergies.

## 2023-09-08 NOTE — Procedures (Unsigned)
 EMG & NCV Findings: Evaluation of the right median motor nerve showed prolonged distal onset latency (4.7 ms), reduced amplitude (3.8 mV), and decreased conduction velocity (Elbow-Wrist, 45 m/s).  The right median (across palm) sensory nerve showed prolonged distal peak latency (Wrist, 3.8 ms).  All remaining nerves (as indicated in the following tables) were within normal limits.  Left vs. Right side comparison data for the median motor nerve indicates abnormal L-R latency difference (1.3 ms).  The ulnar motor nerve indicates abnormal L-R velocity difference (A Elbow-B Elbow, 23 m/s).  All remaining left vs. right side differences were within normal limits.    All examined muscles (as indicated in the following table) showed no evidence of electrical instability.    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   There is no significant electrodiagnostic evidence of any other focal nerve entrapment, brachial plexopathy or cervical radiculopathy.   Recommendations: 1.  Follow-up with referring physician. 2.  Continue current management of symptoms. 3.  Continue use of resting splint at night-time and as needed during the day. 4.  Suggest surgical evaluation.  ___________________________ Amanda Villanueva FAAPMR Board Certified, American Board of Physical Medicine and Rehabilitation    Nerve Conduction Studies Anti Sensory Summary Table   Stim Site NR Peak (ms) Norm Peak (ms) P-T Amp (V) Norm P-T Amp Site1 Site2 Delta-P (ms) Dist (cm) Vel (m/s) Norm Vel (m/s)  Left Median Acr Palm Anti Sensory (2nd Digit)  31.9C  Wrist    3.1 <3.6 41.4 >10 Wrist Palm 1.4 0.0    Palm    1.7 <2.0 39.3         Right Median Acr Palm Anti Sensory (2nd Digit)  32.6C  Wrist    *3.8 <3.6 18.5 >10 Wrist Palm 2.2 0.0    Palm    1.6 <2.0 23.3          Left Radial Anti Sensory (Base 1st Digit)  32.3C  Wrist    2.1 <3.1 25.9  Wrist Base 1st Digit 2.1 0.0    Right Radial Anti Sensory (Base 1st Digit)  32.2C  Wrist    2.2 <3.1 21.4  Wrist Base 1st Digit 2.2 0.0    Left Ulnar Anti Sensory (5th Digit)  32.5C  Wrist    3.2 <3.7 30.1 >15.0 Wrist 5th Digit 3.2 14.0 44 >38  Right Ulnar Anti Sensory (5th Digit)  32.8C  Wrist    3.0 <3.7 39.1 >15.0 Wrist 5th Digit 3.0 14.0 47 >38   Motor Summary Table   Stim Site NR Onset (ms) Norm Onset (ms) O-P Amp (mV) Norm O-P Amp Site1 Site2 Delta-0 (ms) Dist (cm) Vel (m/s) Norm Vel (m/s)  Left Median Motor (Abd Poll Brev)  32.2C  Wrist    3.4 <4.2 5.9 >5 Elbow Wrist 3.5 19.0 54 >50  Elbow    6.9  5.1         Right Median Motor (Abd Poll Brev)  32.4C  Wrist    *4.7 <4.2 *3.8 >5 Elbow Wrist 4.3 19.5 *45 >50  Elbow    9.0  3.3         Left Ulnar Motor (Abd Dig Min)  32.3C  Wrist    3.0 <4.2 7.9 >3 B Elbow Wrist 2.6 18.0 69 >53  B Elbow    5.6  7.9  A Elbow B Elbow 1.3 10.0  77 >53  A Elbow    6.9  7.9         Right Ulnar Motor (Abd Dig Min)  32.4C  Wrist    3.1 <4.2 8.4 >3 B Elbow Wrist 2.6 19.0 73 >53  B Elbow    5.7  8.5  A Elbow B Elbow 1.0 10.0 100 >53  A Elbow    6.7  9.6          EMG   Side Muscle Nerve Root Ins Act Fibs Psw Amp Dur Poly Recrt Int Deatra Face Comment  Right Abd Poll Brev Median C8-T1 Nml Nml Nml Nml Nml 0 Nml Nml   Right 1stDorInt Ulnar C8-T1 Nml Nml Nml Nml Nml 0 Nml Nml   Right PronatorTeres Median C6-7 Nml Nml Nml Nml Nml 0 Nml Nml   Right Biceps Musculocut C5-6 Nml Nml Nml Nml Nml 0 Nml Nml   Right Deltoid Axillary C5-6 Nml Nml Nml Nml Nml 0 Nml Nml     Nerve Conduction Studies Anti Sensory Left/Right Comparison   Stim Site L Lat (ms) R Lat (ms) L-R Lat (ms) L Amp (V) R Amp (V) L-R Amp (%) Site1 Site2 L Vel (m/s) R Vel (m/s) L-R Vel (m/s)  Median Acr Palm Anti Sensory (2nd Digit)  31.9C  Wrist 3.1 *3.8 0.7 41.4 18.5 55.3 Wrist Palm     Palm 1.7 1.6 0.1 39.3 23.3  40.7       Radial Anti Sensory (Base 1st Digit)  32.3C  Wrist 2.1 2.2 0.1 25.9 21.4 17.4 Wrist Base 1st Digit     Ulnar Anti Sensory (5th Digit)  32.5C  Wrist 3.2 3.0 0.2 30.1 39.1 23.0 Wrist 5th Digit 44 47 3   Motor Left/Right Comparison   Stim Site L Lat (ms) R Lat (ms) L-R Lat (ms) L Amp (mV) R Amp (mV) L-R Amp (%) Site1 Site2 L Vel (m/s) R Vel (m/s) L-R Vel (m/s)  Median Motor (Abd Poll Brev)  32.2C  Wrist 3.4 *4.7 *1.3 5.9 *3.8 35.6 Elbow Wrist 54 *45 9  Elbow 6.9 9.0 2.1 5.1 3.3 35.3       Ulnar Motor (Abd Dig Min)  32.3C  Wrist 3.0 3.1 0.1 7.9 8.4 6.0 B Elbow Wrist 69 73 4  B Elbow 5.6 5.7 0.1 7.9 8.5 7.1 A Elbow B Elbow 77 100 *23  A Elbow 6.9 6.7 0.2 7.9 9.6 17.7          Waveforms:

## 2023-09-10 ENCOUNTER — Encounter: Payer: Self-pay | Admitting: Physical Medicine and Rehabilitation

## 2023-09-11 ENCOUNTER — Telehealth: Payer: Self-pay | Admitting: Orthopedic Surgery

## 2023-09-11 NOTE — Telephone Encounter (Signed)
 Pt states she was returning a call from the office. No voicemail was left and there's no message in the chart.

## 2023-09-16 ENCOUNTER — Ambulatory Visit (INDEPENDENT_AMBULATORY_CARE_PROVIDER_SITE_OTHER): Admitting: Orthopedic Surgery

## 2023-09-16 DIAGNOSIS — G5601 Carpal tunnel syndrome, right upper limb: Secondary | ICD-10-CM

## 2023-09-16 NOTE — Progress Notes (Unsigned)
 Amanda Villanueva - 57 y.o. female MRN [id number]  Date of birth: [birth year]  Office Visit Note: Visit Date: 09/16/2023 PCP: Dianah Fort, PA Referred by: Dianah Fort, PA  Subjective: No chief complaint on file.  HPI: Amanda Villanueva is a 58 y.o. female who presents today for follow-up of bilateral hand complaints.  She states that she has ongoing numbness and tingling in the bilateral hands, of both the radial and ulnar sided digits which has been progressive now over the past multiple months.  She has undergone prior treatment with braces of her wrist for nocturnal symptoms, underwent recent injections to the bilateral carpal tunnels approximately 1 month prior by sports medicine.  States that she got minimal relief from the injections.  She also has a history of a prior cyst along the ulnar border of the wrist which has been present now for multiple months, has not undergone prior intervention.  She has a history notable for prior left small finger infection status post extensive irrigation and debridement with subsequent persistent numbness of the small finger.  Surgery was performed multiple years prior.  She also has a prior history of a right index finger infection status post previous irrigation and debridement with subsequent numbness to the distal aspect of the index finger which has also been persistent.  She is here today for review of her recent electrodiagnostic study of the right upper extremity.  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   Pertinent ROS were reviewed with the patient and found to be negative unless otherwise specified above in HPI.    Assessment & Plan: Visit Diagnoses:  1. Carpal tunnel syndrome, right upper limb      Plan: Extensive discussion was had with the patient today about her  ongoing right sided carpal tunnel syndrome that is refractory to conservative care.  Patient has both clinical and electrodiagnostic evidence to confirm this diagnosis.  At this juncture, the is indicated for right open versus endoscopic carpal tunnel release.  Risks and benefits of both operations were discussed in detail today as well as forms of anesthesia.  Understanding all risks and benefits, patient would like to have surgery done in the form of right open carpal tunnel release carpal tunnel release under IV sedation.  We did discuss the ulnar-sided cyst at the wrist level, this is quite minimal from a size and symptom standpoint at this time, there was no significant swelling of the ulnar nerve at the wrist level based on electrodiagnostic study.  We discussed the potential for exploration and removal in this region, I did raise concern for possible ulnar artery aneurysm in this region as well based on the clinical examination today as there does appear to be pulsatile feeling near this elevated site.  Risks and benefits of exploration in this region were discussed as well as possible need for arterial excision and repair of an aneurysm or defect.  Given her minimal symptoms, she would like to forego this for the time being and we will focus solely on the carpal tunnel which I am in agreement with.  Risks of the carpal tunnel release include but not limited to infection, bleeding, scarring, stiffness, nerve injury or vascular, tendon injury, risk of recurrence and need for subsequent operation were all discussed in detail.  Patient consented understanding the above.  Will move forward surgical  scheduling of right open carpal tunnel release under IV sedation, we will target the end of June at a surgical date.   Follow-up: No follow-ups on file.   Meds & Orders: No orders of the defined types were placed in this encounter.   No orders of the defined types were placed in this encounter.     Procedures: No procedures performed      Clinical History: No specialty comments available.  She reports that she quit smoking about 11 years ago. Her smoking use included cigarettes. She has been exposed to tobacco smoke. She has never used smokeless tobacco. No results for input(s): "HGBA1C", "LABURIC" in the last 8760 hours.  Objective:   Vital Signs: LMP 05/11/2018   Physical Exam  Gen: Well-appearing, in no acute distress; non-toxic CV: Regular Rate. Well-perfused. Warm.  Resp: Breathing unlabored on room air; no wheezing. Psych: Fluid speech in conversation; appropriate affect; normal thought process  Ortho Exam PHYSICAL EXAM:  General: Patient is well appearing and in no distress. Cervical spine mobility is full in all directions:  Skin and Muscle: Prior well healed incision along the volar aspect of the left small finger  Range of Motion and Palpation Tests: Mobility is full about the elbows with flexion and extension.  No evidence of nerve subluxation at bilateral elbow.  Forearm supination and pronation are 85/85 bilaterally.  Wrist flexion/extension is 75/65 bilaterally.  Digital flexion and extension are full.  Thumb opposition is full to the base of the small fingers bilaterally.    Palpable mass of the ulnar border of the right wrist, proximal to the wrist crease, measures approximately 1 cm x 1.5 cm, mobile, soft, compressible  Neurologic, Vascular, Motor: Sensation is diminished to light touch in the bilateral median and ulnar distribution.    Thenar atrophy: Negative bilaterally Tinel sign: Positive bilateral carpal tunnel Carpal tunnel compression: Positive bilateral Phalen test: Positive bilateral  Motor bilateral hand FPL: 5/5 Index FDP: 5/5 APB: 5/5  Fingers pink and well perfused.  Capillary refill is brisk.     No results found for: "HGBA1C"   Imaging: No results found.   Past Medical/Family/Surgical/Social History: Medications &  Allergies reviewed per EMR, new medications updated. Patient Active Problem List   Diagnosis Date Noted   Lumbar radiculopathy 09/12/2022   Ganglion cyst of dorsum of right wrist 01/10/2022   Diverticulosis 09/21/2021   Hemangioma of intra-abdominal structure 09/21/2021   Asthma 07/25/2020   OA (osteoarthritis) of knee 09/30/2019   Degenerative disc disease at L5-S1 level 09/21/2019   Piriformis syndrome of left side 07/13/2019   Carpal tunnel syndrome of right wrist 07/13/2019   Seasonal allergic conjunctivitis 11/30/2015   Moderate persistent asthma 10/18/2015   Seasonal and perennial allergic rhinitis 10/18/2015   Past Medical History:  Diagnosis Date   Asthma    Family History  Problem Relation Age of Onset   Stroke Mother    Allergic rhinitis Father    Pneumonia Daughter    Multiple sclerosis Daughter    Past Surgical History:  Procedure Laterality Date   ABDOMINAL HYSTERECTOMY     KNEE SURGERY     bilateral   TUBAL LIGATION     Social History   Occupational History   Not on file  Tobacco Use   Smoking status: Former    Current packs/day: 0.00    Types: Cigarettes    Quit date: 09/12/2012    Years since quitting: 11.0    Passive exposure: Past   Smokeless tobacco:  Never  Vaping Use   Vaping status: Never Used  Substance and Sexual Activity   Alcohol use: Yes    Alcohol/week: 1.0 standard drink of alcohol    Types: 1 Glasses of wine per week   Drug use: Yes    Types: Marijuana   Sexual activity: Yes    Birth control/protection: Surgical    Deedra Pro Merlinda Starling) Marce Sensing, M.D. [facility], Hand Surgery

## 2023-09-27 ENCOUNTER — Other Ambulatory Visit: Payer: Self-pay | Admitting: Internal Medicine

## 2023-09-30 NOTE — Telephone Encounter (Signed)
 Called pt and LVM to call office for refills

## 2023-09-30 NOTE — Telephone Encounter (Signed)
 Called pt to schedule a f/u no answer, LVM to call office for an appt. In order to get refills.

## 2023-10-23 ENCOUNTER — Other Ambulatory Visit: Payer: Self-pay

## 2023-10-23 ENCOUNTER — Encounter (HOSPITAL_BASED_OUTPATIENT_CLINIC_OR_DEPARTMENT_OTHER): Payer: Self-pay | Admitting: Orthopedic Surgery

## 2023-10-23 DIAGNOSIS — G5601 Carpal tunnel syndrome, right upper limb: Secondary | ICD-10-CM

## 2023-10-31 ENCOUNTER — Other Ambulatory Visit: Payer: Self-pay

## 2023-10-31 ENCOUNTER — Ambulatory Visit (HOSPITAL_BASED_OUTPATIENT_CLINIC_OR_DEPARTMENT_OTHER): Payer: Self-pay | Admitting: Anesthesiology

## 2023-10-31 ENCOUNTER — Encounter (HOSPITAL_BASED_OUTPATIENT_CLINIC_OR_DEPARTMENT_OTHER)
Admission: RE | Disposition: A | Discharge status: Home / Self Care | Payer: Self-pay | Source: Home / Self Care | Attending: Orthopedic Surgery

## 2023-10-31 ENCOUNTER — Ambulatory Visit (HOSPITAL_BASED_OUTPATIENT_CLINIC_OR_DEPARTMENT_OTHER)
Admission: RE | Admit: 2023-10-31 | Discharge: 2023-10-31 | Disposition: A | Discharge status: Home / Self Care | Attending: Orthopedic Surgery | Admitting: Orthopedic Surgery

## 2023-10-31 ENCOUNTER — Encounter (HOSPITAL_BASED_OUTPATIENT_CLINIC_OR_DEPARTMENT_OTHER): Payer: Self-pay | Admitting: Orthopedic Surgery

## 2023-10-31 DIAGNOSIS — Z01818 Encounter for other preprocedural examination: Secondary | ICD-10-CM

## 2023-10-31 DIAGNOSIS — Z87891 Personal history of nicotine dependence: Secondary | ICD-10-CM | POA: Diagnosis not present

## 2023-10-31 DIAGNOSIS — J45909 Unspecified asthma, uncomplicated: Secondary | ICD-10-CM | POA: Diagnosis not present

## 2023-10-31 DIAGNOSIS — G5601 Carpal tunnel syndrome, right upper limb: Secondary | ICD-10-CM | POA: Diagnosis not present

## 2023-10-31 HISTORY — PX: CARPAL TUNNEL RELEASE: SHX101

## 2023-10-31 SURGERY — CARPAL TUNNEL RELEASE
Anesthesia: Monitor Anesthesia Care | Site: Hand | Laterality: Right

## 2023-10-31 MED ORDER — OXYCODONE HCL 5 MG/5ML PO SOLN: 5.0000 mg | Freq: Once | ORAL | Status: DC | PRN

## 2023-10-31 MED ORDER — MIDAZOLAM HCL 2 MG/2ML IJ SOLN
INTRAMUSCULAR | Status: AC
  Filled 2023-10-31: qty 2

## 2023-10-31 MED ORDER — LIDOCAINE-EPINEPHRINE (PF) 1 %-1:200000 IJ SOLN
INTRAMUSCULAR | Status: DC | PRN
Start: 2023-10-31 — End: 2023-10-31
  Administered 2023-10-31: 10 mL via INTRADERMAL

## 2023-10-31 MED ORDER — FENTANYL CITRATE (PF) 100 MCG/2ML IJ SOLN
INTRAMUSCULAR | Status: AC
  Filled 2023-10-31: qty 2

## 2023-10-31 MED ORDER — LACTATED RINGERS IV SOLN: INTRAVENOUS | Status: DC

## 2023-10-31 MED ORDER — PROPOFOL 500 MG/50ML IV EMUL
INTRAVENOUS | Status: DC | PRN
  Administered 2023-10-31: 100 ug/kg/min via INTRAVENOUS
  Administered 2023-10-31: 20 mg via INTRAVENOUS
  Administered 2023-10-31: 15 mg via INTRAVENOUS
  Administered 2023-10-31: 20 mg via INTRAVENOUS

## 2023-10-31 MED ORDER — PROPOFOL 10 MG/ML IV BOLUS
INTRAVENOUS | Status: AC
  Filled 2023-10-31: qty 20

## 2023-10-31 MED ORDER — ACETAMINOPHEN 500 MG PO TABS
ORAL_TABLET | ORAL | Status: AC
  Filled 2023-10-31: qty 2

## 2023-10-31 MED ORDER — FENTANYL CITRATE (PF) 100 MCG/2ML IJ SOLN: 25.0000 ug | INTRAMUSCULAR | Status: DC | PRN

## 2023-10-31 MED ORDER — ACETAMINOPHEN 500 MG PO TABS
1000.0000 mg | ORAL_TABLET | Freq: Once | ORAL | Status: AC
  Administered 2023-10-31: 1000 mg via ORAL

## 2023-10-31 MED ORDER — AMISULPRIDE (ANTIEMETIC) 5 MG/2ML IV SOLN: 10.0000 mg | Freq: Once | INTRAVENOUS | Status: DC | PRN

## 2023-10-31 MED ORDER — MIDAZOLAM HCL 2 MG/2ML IJ SOLN
INTRAMUSCULAR | Status: DC | PRN
  Administered 2023-10-31: 2 mg via INTRAVENOUS

## 2023-10-31 MED ORDER — CEFAZOLIN SODIUM-DEXTROSE 2-4 GM/100ML-% IV SOLN
2.0000 g | INTRAVENOUS | Status: AC
  Administered 2023-10-31: 2 g via INTRAVENOUS

## 2023-10-31 MED ORDER — DEXAMETHASONE SODIUM PHOSPHATE 10 MG/ML IJ SOLN
INTRAMUSCULAR | Status: AC
  Filled 2023-10-31: qty 1

## 2023-10-31 MED ORDER — OXYCODONE HCL 5 MG PO TABS: 5.0000 mg | ORAL_TABLET | Freq: Once | ORAL | Status: DC | PRN

## 2023-10-31 MED ORDER — ACETAMINOPHEN-CODEINE 300-30 MG PO TABS: 1.0000 | ORAL_TABLET | Freq: Four times a day (QID) | ORAL | 0 refills | Status: AC | PRN

## 2023-10-31 MED ORDER — FENTANYL CITRATE (PF) 100 MCG/2ML IJ SOLN
INTRAMUSCULAR | Status: DC | PRN
  Administered 2023-10-31: 50 ug via INTRAVENOUS

## 2023-10-31 MED ORDER — ONDANSETRON HCL 4 MG/2ML IJ SOLN
INTRAMUSCULAR | Status: DC | PRN
  Administered 2023-10-31: 4 mg via INTRAVENOUS

## 2023-10-31 MED ORDER — KETOROLAC TROMETHAMINE 30 MG/ML IJ SOLN: 30.0000 mg | Freq: Once | INTRAMUSCULAR | Status: DC | PRN

## 2023-10-31 MED ORDER — ONDANSETRON HCL 4 MG/2ML IJ SOLN
INTRAMUSCULAR | Status: AC
  Filled 2023-10-31: qty 2

## 2023-10-31 MED ORDER — LIDOCAINE 2% (20 MG/ML) 5 ML SYRINGE
INTRAMUSCULAR | Status: AC
  Filled 2023-10-31: qty 5

## 2023-10-31 MED ORDER — CEFAZOLIN SODIUM-DEXTROSE 2-4 GM/100ML-% IV SOLN
INTRAVENOUS | Status: AC
Start: 2023-10-31 — End: 2023-10-31
  Filled 2023-10-31: qty 100

## 2023-10-31 MED ORDER — LIDOCAINE-EPINEPHRINE (PF) 1 %-1:200000 IJ SOLN
INTRAMUSCULAR | Status: AC
  Filled 2023-10-31: qty 30

## 2023-10-31 SURGICAL SUPPLY — 36 items
BLADE MINI RND TIP GREEN BEAV (BLADE) ×1 IMPLANT
BLADE SURG 15 STRL LF DISP TIS (BLADE) ×2 IMPLANT
BNDG COHESIVE 4X5 TAN STRL LF (GAUZE/BANDAGES/DRESSINGS) ×1 IMPLANT
BNDG ELASTIC 4INX 5YD STR LF (GAUZE/BANDAGES/DRESSINGS) ×1 IMPLANT
BNDG ESMARK 4X9 LF (GAUZE/BANDAGES/DRESSINGS) ×1 IMPLANT
BNDG GAUZE DERMACEA FLUFF 4 (GAUZE/BANDAGES/DRESSINGS) ×1 IMPLANT
CHLORAPREP W/TINT 26 (MISCELLANEOUS) ×1 IMPLANT
CORD BIPOLAR FORCEPS 12FT (ELECTRODE) ×1 IMPLANT
COVER BACK TABLE 60X90IN (DRAPES) ×1 IMPLANT
CUFF TOURN SGL QUICK 18X4 (TOURNIQUET CUFF) IMPLANT
DRAPE HAND 77X146 (DRAPES) ×1 IMPLANT
DRAPE SURG 17X23 STRL (DRAPES) ×1 IMPLANT
GAUZE PAD ABD 8X10 STRL (GAUZE/BANDAGES/DRESSINGS) ×1 IMPLANT
GAUZE SPONGE 4X4 12PLY STRL (GAUZE/BANDAGES/DRESSINGS) ×1 IMPLANT
GAUZE STRETCH 2X75IN STRL (MISCELLANEOUS) ×1 IMPLANT
GAUZE XEROFORM 1X8 LF (GAUZE/BANDAGES/DRESSINGS) ×1 IMPLANT
GLOVE BIO SURGEON STRL SZ7.5 (GLOVE) ×1 IMPLANT
GLOVE BIOGEL PI IND STRL 7.5 (GLOVE) ×1 IMPLANT
GOWN STRL REUS W/ TWL LRG LVL3 (GOWN DISPOSABLE) ×2 IMPLANT
GOWN STRL SURGICAL XL XLNG (GOWN DISPOSABLE) ×2 IMPLANT
NDL HYPO 25X5/8 SAFETYGLIDE (NEEDLE) IMPLANT
NEEDLE HYPO 25X5/8 SAFETYGLIDE (NEEDLE) IMPLANT
NS IRRIG 1000ML POUR BTL (IV SOLUTION) IMPLANT
PACK BASIN DAY SURGERY FS (CUSTOM PROCEDURE TRAY) ×1 IMPLANT
SHEET MEDIUM DRAPE 40X70 STRL (DRAPES) ×1 IMPLANT
SPIKE FLUID TRANSFER (MISCELLANEOUS) IMPLANT
SPLINT FIBERGLASS 4X30 (CAST SUPPLIES) IMPLANT
STOCKINETTE IMPERVIOUS 9X36 MD (GAUZE/BANDAGES/DRESSINGS) ×1 IMPLANT
SUCTION TUBE FRAZIER 10FR DISP (SUCTIONS) IMPLANT
SUT ETHILON 4 0 PS 2 18 (SUTURE) ×1 IMPLANT
SUT VIC AB 3-0 SH 27X BRD (SUTURE) IMPLANT
SYR BULB EAR ULCER 3OZ GRN STR (SYRINGE) ×2 IMPLANT
SYR CONTROL 10ML LL (SYRINGE) ×1 IMPLANT
TOWEL GREEN STERILE FF (TOWEL DISPOSABLE) ×2 IMPLANT
TUBE CONNECTING 20X1/4 (TUBING) IMPLANT
UNDERPAD 30X36 HEAVY ABSORB (UNDERPADS AND DIAPERS) ×1 IMPLANT

## 2023-10-31 NOTE — Anesthesia Preprocedure Evaluation (Addendum)
 Anesthesia Evaluation  Patient identified by MRN, date of birth, ID band Patient awake    Reviewed: Allergy & Precautions, NPO status , Patient's Chart, lab work & pertinent test results  Airway Mallampati: II  TM Distance: >3 FB Neck ROM: Full    Dental no notable dental hx.    Pulmonary asthma , former smoker   Pulmonary exam normal        Cardiovascular negative cardio ROS Normal cardiovascular exam     Neuro/Psych  Neuromuscular disease  negative psych ROS   GI/Hepatic negative GI ROS, Neg liver ROS,,,  Endo/Other  negative endocrine ROS    Renal/GU negative Renal ROS     Musculoskeletal  (+) Arthritis ,    Abdominal   Peds  Hematology negative hematology ROS (+)   Anesthesia Other Findings RIGHT CARPAL TUNNEL SYNDROME  Reproductive/Obstetrics                             Anesthesia Physical Anesthesia Plan  ASA: 2  Anesthesia Plan: MAC   Post-op Pain Management:    Induction:   PONV Risk Score and Plan: 2 and Ondansetron, Dexamethasone, Propofol infusion, Midazolam and Treatment may vary due to age or medical condition  Airway Management Planned: Simple Face Mask  Additional Equipment:   Intra-op Plan:   Post-operative Plan:   Informed Consent: I have reviewed the patients History and Physical, chart, labs and discussed the procedure including the risks, benefits and alternatives for the proposed anesthesia with the patient or authorized representative who has indicated his/her understanding and acceptance.     Dental advisory given  Plan Discussed with: CRNA  Anesthesia Plan Comments:        Anesthesia Quick Evaluation

## 2023-10-31 NOTE — Discharge Instructions (Addendum)
    Hand Surgery Postop Instructions    Dressings: Maintain postoperative dressing for 5 days.   After 5 days, it is okay to unwrap postoperative dressing and apply Band-Aid or rewrap.   Keep operative site clean and dry until orthopedic follow-up.  Wound Care: Keep your hand elevated above the level of your heart.  Do not allow it to dangle by your side. Moving your fingers is advised to stimulate circulation but will depend on the site of your surgery.  If you have a splint applied, your doctor will advise you regarding movement.  Activity: Do not drive or operate machinery until clearance given from physician. No heavy lifting with operative extremity.  Diet:  Drink liquids today or eat a light diet.  You may resume a regular diet tomorrow.    General expectations: Pain for two to three days. Take prescribed medication if given, transition to over-the-counter medication as quickly as possible. Fingers may become slightly swollen.  Call your doctor if any of the following occur: Severe pain not relieved by pain medication. Elevated temperature. Dressing soaked with blood. Inability to move fingers. White or bluish color to fingers.   Per [facility] clinic policy, our goal is ensure optimal postoperative pain control with a multimodal pain management strategy. For all [facility] patients, our goal is to wean post-operative narcotic medications by 6 weeks post-operatively. If this is not possible due to utilization of pain medication prior to surgery, your [facility] doctor will support your acute post-operative pain control for the first 6 weeks postoperatively, with a plan to transition you back to your primary pain team following that. [facility]e will work to ensure a Therapist, occupational.  Anshul Afton Alderton, M.D. Hand Surgery [facility]  No tylenol  before 1pm.   Post Anesthesia Home Care Instructions  Activity: Get plenty of rest for the remainder of the day. A  responsible individual must stay with you for 24 hours following the procedure.  For the next 24 hours, DO NOT: -Drive a car -Advertising copywriter -Drink alcoholic beverages -Take any medication unless instructed by your physician -Make any legal decisions or sign important papers.  Meals: Start with liquid foods such as gelatin or soup. Progress to regular foods as tolerated. Avoid greasy, spicy, heavy foods. If nausea and/or vomiting occur, drink only clear liquids until the nausea and/or vomiting subsides. Call your physician if vomiting continues.  Special Instructions/Symptoms: Your throat may feel dry or sore from the anesthesia or the breathing tube placed in your throat during surgery. If this causes discomfort, gargle with warm salt water. The discomfort should disappear within 24 hours.  If you had a scopolamine patch placed behind your ear for the management of post- operative nausea and/or vomiting:  1. The medication in the patch is effective for 72 hours, after which it should be removed.  Wrap patch in a tissue and discard in the trash. Wash hands thoroughly with soap and water. 2. You may remove the patch earlier than 72 hours if you experience unpleasant side effects which may include dry mouth, dizziness or visual disturbances. 3. Avoid touching the patch. Wash your hands with soap and water after contact with the patch.

## 2023-10-31 NOTE — Op Note (Signed)
 NAME: Amanda Villanueva MEDICAL RECORD NO: [id number] DATE OF BIRTH: [birth year] FACILITY: [facility] LOCATION: [facility] PHYSICIAN: GILDARDO ALDERTON, MD   OPERATIVE REPORT   DATE OF PROCEDURE: 10/31/23    PREOPERATIVE DIAGNOSIS:  Right carpal tunnel syndrome   POSTOPERATIVE DIAGNOSIS:  Right carpal tunnel syndrome   PROCEDURE: Right open carpal tunnel release   SURGEON:  GILDARDO ALDERTON, M.D.   ASSISTANT: None   ANESTHESIA:  Local with sedation   INTRAVENOUS FLUIDS:  Per anesthesia flow sheet.   ESTIMATED BLOOD LOSS:  Minimal.   COMPLICATIONS:  None.   SPECIMENS:  none   TOURNIQUET TIME:    Total Tourniquet Time Documented: Upper Arm (Right) - 5 minutes Total: Upper Arm (Right) - 5 minutes    DISPOSITION:  Stable to PACU.   INDICATIONS: 6 female with history of right carpal tunnel syndrome refractory to conservative care, indicated for right open carpal tunnel release.  Risks and benefits of surgery were discussed including the risks of infection, bleeding, scarring, stiffness, nerve injury, vascular injury, tendon injury, need for subsequent operation, , need for repeat irrigation and debridement.  She voiced understanding of these risks and elected to proceed.  OPERATIVE COURSE: Patient was seen and identified in the preoperative area and marked appropriately.  Surgical consent had been signed. Preoperative IV antibiotic prophylaxis was given. She was transferred to the operating room and placed in supine position with the Right upper extremity on an arm board.  Sedation was induced by the anesthesiologist.  Right upper extremity was prepped and draped in normal sterile orthopedic fashion.  A surgical pause was performed between the surgeons, anesthesia, and operating room staff and all were in agreement as to the patient, procedure, and site of procedure.  Tourniquet was placed and padded appropriately to the right upper arm.  10 cc of 1% lidocaine with  epinephrine was utilized around the planned incisional site.  The arm was exsanguinated and the tourniquet inflated to 250 mmHg.  Longitudinal incision was designed in the thenar crease in line with the radial border of the ring finger, from intersection of Kaplan's cardinal line down to level of the distal wrist crease. Incision was carried down utilizing 15 blade. Blunt dissection was performed, palmar fascia was identified and incised sharply utilizing a Beaver blade. Careful dissection was performed down, thenar musculature was bluntly elevated and the transcarpal ligament was identified.  A Beaver blade was then utilized to divide the transcarpal ligament in a distal to proximal fashion.  Fat surrounding the palmar arch was encountered to confirm appropriate distal release.  At the level of the wrist crease, skin flaps were elevated to allow for release of the proximal portion of transverse carpal ligament as well as the antebrachial fascia into the forearm. Appropriate decompression was noted of the median nerve, care was taken to protect the nerve in its entirety throughout. Once we were satisfied with our proximal and distal release, tourniquet was deflated and bipolar electrocautery was utilized for hemostasis.  Tourniquet time was 5 minutes.  Copious irrigation was performed followed by closure utilizing 4-0 nylon in standard fashion for the skin surface. Sterile dressings were applied followed by a loosefitting soft hand dressing. Patient was subsequently awoken from anesthesia and transported to the postop recovery unit in stable condition.   Fingertips were pink with brisk capillary refill after deflation of tourniquet.  The operative drapes were broken down.  The patient was awoken from anesthesia safely and taken to PACU in stable condition.  Post-operative plan: The patient will recover in the post-anesthesia care unit and then be discharged home.  The patient will be non weight bearing on the  right upper extremity in a soft dressing with instructions for appropriate dressing maintenance, removal and wound care.   I will see the patient back in the office in 2 weeks for postoperative followup.    Alleene Stoy, MD Electronically signed, 10/31/23

## 2023-10-31 NOTE — H&P (Signed)
 @LOGODEPT @  Amanda Villanueva - 58 y.o. female MRN [id number]  Date of birth: Aug 12, 1965   HAND SURGERY H&P UPDATE   HPI: Patient is a 58 y.o. female who presents with right carpal tunnel syndrome.  Patient denies any changes to their medical history or new systemic symptoms today.    Past Medical History:  Diagnosis Date   Asthma    Past Surgical History:  Procedure Laterality Date   ABDOMINAL HYSTERECTOMY     KNEE SURGERY     bilateral   TUBAL LIGATION     Social History   Socioeconomic History   Marital status: Single    Spouse name: Not on file   Number of children: Not on file   Years of education: Not on file   Highest education level: Not on file  Occupational History   Not on file  Tobacco Use   Smoking status: Former    Current packs/day: 0.00    Types: Cigarettes    Quit date: 09/12/2012    Years since quitting: 11.1    Passive exposure: Past   Smokeless tobacco: Never  Vaping Use   Vaping status: Never Used  Substance and Sexual Activity   Alcohol use: Yes    Alcohol/week: 1.0 standard drink of alcohol    Types: 1 Glasses of wine per week    Comment: socially   Drug use: Yes    Types: Marijuana   Sexual activity: Yes    Birth control/protection: Surgical  Other Topics Concern   Not on file  Social History Narrative   Not on file   Social Drivers of Health   Financial Resource Strain: Low Risk  (05/05/2023)   Received from [facility]   Overall Financial Resource Strain (CARDIA)    Difficulty of Paying Living Expenses: Not hard at all  Food Insecurity: No Food Insecurity (05/05/2023)   Received from [facility]   Hunger Vital Sign    Within the past 12 months, you worried that your food would run out before you got the money to buy more.: Never true    Within the past 12 months, the food you bought just didn't last and you didn't have money to get more.: Never true  Transportation Needs: No Transportation Needs (05/05/2023)   Received  from [facility] - Transportation    Lack of Transportation (Medical): No    Lack of Transportation (Non-Medical): No  Physical Activity: Not on file  Stress: Not on file  Social Connections: Unknown (09/18/2021)   Received from [facility]   Social Network    Social Network: Not on file   Family History  Problem Relation Age of Onset   Stroke Mother    Allergic rhinitis Father    Pneumonia Daughter    Multiple sclerosis Daughter    - negative except otherwise stated in the family history section No Known Allergies Prior to Admission medications   Medication Sig Start Date End Date Taking? Authorizing Provider  Albuterol -Budesonide (AIRSUPRA ) 90-80 MCG/ACT AERO Inhale 2 puffs into the lungs every 4 (four) hours as needed. 02/10/23  Yes Lorin Norris, MD  aspirin EC 81 MG tablet Take 81 mg by mouth daily. Swallow whole.   Yes [provider]  atorvastatin (LIPITOR) 20 MG tablet Take 20 mg by mouth daily.   Yes [provider]  cyclobenzaprine  (FLEXERIL ) 10 MG tablet Take 10 mg by mouth 3 (three) times daily as needed. 07/26/20  Yes [provider]  diphenhydrAMINE (  BENADRYL) 25 MG tablet Take 25 mg by mouth every 6 (six) hours as needed.   Yes [provider]  montelukast  (SINGULAIR ) 10 MG tablet Take 1 tablet (10 mg total) by mouth at bedtime. 02/10/23  Yes Lorin Norris, MD  albuterol  (PROVENTIL ) (2.5 MG/3ML) 0.083% nebulizer solution Take 2.5 mg by nebulization every 6 (six) hours as needed for wheezing or shortness of breath.    [provider]  albuterol  (VENTOLIN  HFA) 108 (90 Base) MCG/ACT inhaler Inhale 2 puffs into the lungs every 4 (four) hours as needed for wheezing or shortness of breath.    [provider]   No results found. - Positive ROS: All other systems have been reviewed and were otherwise negative with the exception of those mentioned in the HPI and as above.  Physical Exam: General: No acute  distress, resting comfortably Cardiovascular: BUE warm and well perfused, normal rate Respiratory: Normal WOB on RA Skin: Warm and dry Neurologic: Sensation intact distally Psychiatric: Patient is at baseline mood and affect   PHYSICAL EXAM:   Range of Motion and Palpation Tests: Mobility is full about the elbows with flexion and extension.  No evidence of nerve subluxation at bilateral elbow.  Forearm supination and pronation are 85/85 bilaterally.  Wrist flexion/extension is 75/65 bilaterally.  Digital flexion and extension are full.  Thumb opposition is full to the base of the small fingers bilaterally.     Palpable mass of the ulnar border of the right wrist, proximal to the wrist crease, measures approximately 1 cm x 1.5 cm, mobile, soft, compressible   Neurologic, Vascular, Motor: Sensation is diminished to light touch in the bilateral median and ulnar distribution.    Thenar atrophy: Negative bilaterally Tinel sign: Positive bilateral carpal tunnel Carpal tunnel compression: Positive bilateral Phalen test: Positive bilateral   Motor bilateral hand FPL: 5/5 Index FDP: 5/5 APB: 5/5   Fingers pink and well perfused.  Capillary refill is brisk.     Assessment/Plan: OR today for right open carpal tunnel release. We again reviewed the risks of surgery which include bleeding, infection, damage to neurovascular structures, persistent symptoms, need for additional surgery.  Informed consent was signed.  All questions were answered.   Nashae Maudlin [facility], Hand Surgery

## 2023-10-31 NOTE — Transfer of Care (Signed)
 Immediate Anesthesia Transfer of Care Note  Patient: Amanda Villanueva  Procedure(s) Performed: CARPAL TUNNEL RELEASE (Right: Hand)  Patient Location: PACU  Anesthesia Type:MAC  Level of Consciousness: awake, alert , and patient cooperative  Airway & Oxygen Therapy: Patient Spontanous Breathing and Patient connected to face mask oxygen  Post-op Assessment: Report given to RN and Post -op Vital signs reviewed and stable  Post vital signs: Reviewed and stable  Last Vitals:  Vitals Value Taken Time  BP    Temp    Pulse    Resp 16 10/31/23 08:09  SpO2    Vitals shown include unfiled device data.  Last Pain:  Vitals:   10/31/23 0633  TempSrc: Oral  PainSc: 8          Complications: No notable events documented.

## 2023-11-01 NOTE — Anesthesia Postprocedure Evaluation (Signed)
 Anesthesia Post Note  Patient: Amanda Villanueva  Procedure(s) Performed: CARPAL TUNNEL RELEASE (Right: Hand)     Patient location during evaluation: PACU Anesthesia Type: MAC Level of consciousness: awake Pain management: pain level controlled Vital Signs Assessment: post-procedure vital signs reviewed and stable Respiratory status: spontaneous breathing, nonlabored ventilation and respiratory function stable Cardiovascular status: blood pressure returned to baseline and stable Postop Assessment: no apparent nausea or vomiting Anesthetic complications: no   No notable events documented.  Last Vitals:  Vitals:   10/31/23 0841 10/31/23 0855  BP:  122/68  Pulse: (!) 58 (!) 57  Resp: 11 14  Temp:  36.6 C  SpO2: 95% 96%    Last Pain:  Vitals:   10/31/23 0633  TempSrc: Oral  PainSc: 8                  Krystal Delduca P Merton Wadlow

## 2023-11-02 ENCOUNTER — Encounter (HOSPITAL_BASED_OUTPATIENT_CLINIC_OR_DEPARTMENT_OTHER): Payer: Self-pay | Admitting: Orthopedic Surgery

## 2023-11-11 NOTE — Therapy (Signed)
 OUTPATIENT OCCUPATIONAL THERAPY ORTHO EVALUATION AND treatment note   Patient Name: Amanda Villanueva MRN: [id number] DOB:25-May-1965, 58 y.o., female Today's Date: 11/13/2023  REFERRING PROVIDER: Arlinda Buster, MD   END OF SESSION:   Past Medical History:  Diagnosis Date   Asthma    Past Surgical History:  Procedure Laterality Date   ABDOMINAL HYSTERECTOMY     CARPAL TUNNEL RELEASE Right 10/31/2023   Procedure: CARPAL TUNNEL RELEASE;  Surgeon: Arlinda Buster, MD;  Location: [facility];  Service: Orthopedics;  Laterality: Right;  RIGHT OPEN CARPAL TUNNEL RELEASE   KNEE SURGERY     bilateral   TUBAL LIGATION     Patient Active Problem List   Diagnosis Date Noted   Lumbar radiculopathy 09/12/2022   Ganglion cyst of dorsum of right wrist 01/10/2022   Diverticulosis 09/21/2021   Hemangioma of intra-abdominal structure 09/21/2021   Asthma 07/25/2020   OA (osteoarthritis) of knee 09/30/2019   Degenerative disc disease at L5-S1 level 09/21/2019   Piriformis syndrome of left side 07/13/2019   Carpal tunnel syndrome, right upper limb 07/13/2019   Seasonal allergic conjunctivitis 11/30/2015   Moderate persistent asthma 10/18/2015   Seasonal and perennial allergic rhinitis 10/18/2015     ONSET DATE:  DOS 10/31/23  REFERRING DIAG: G56.01 (ICD-10-CM) - Carpal tunnel syndrome, right upper limb   THERAPY DIAG:     Localized edema  Muscle weakness (generalized)  Other lack of coordination  Pain in right hand  Paresthesia of skin  Rationale for Evaluation and Treatment: Rehabilitation  SUBJECTIVE:   SUBJECTIVE STATEMENT: The patient states hx of paresthesia and pain in their right hand and subsequent surgical release of the carpal tunnel. Now the patient states having some lingering paresthesia along with hypersensitivity, stiffness, pain, decreased ability to make a fist and perform I/ADLs.     PERTINENT HISTORY: The patient is now approx 2 weeks s/p  Rt hand CTR.   PRECAUTIONS: None relative to this evaluation and episode of care.   RED FLAGS: None   WEIGHT BEARING RESTRICTIONS: Yes: caution with weightbearing for the next 4-6 weeks, recommended less than 5lbs for next 2 weeks with affected hand  PAIN:  Are you having pain? Yes: NPRS scale: 5.5/10 at rest right now (while on Tylenol ) Pain location:  sx area Pain description: aching and sore Aggravating factors: gripping/squeezing Relieving factors: rest  FALLS: Has patient fallen in last 6 months? No, not a fall risk  PLOF: Independent with I/ADLs  PATIENT GOALS: To improve motion, function with affected surgical hand  NEXT MD VISIT: PRN    OBJECTIVE MEASURES:   ADLs: Overall ADLs: States decreased ability to grab, hold household objects, pain and difficulty to open containers, perform FMS tasks (manipulate fasteners on clothing).     UPPER EXTREMITY ROM:     A/ROM Right eval Left eval  Wrist flexion 31 70  Wrist extension 28 59  (Blank rows = not tested)                    Hand A/ROM Right eval  Full Fist Ability (or Gap to Distal Palmar Crease) Unable due to stiffness/soreness  Thumb Opposition  (Kapandji Scale)  3/10  Thumb MCP (0-60)   Thumb IP (0-80)   Index MCP (0-90)   Index PIP (0-100)   Index DIP (0-70)    Long MCP (0-90)    Long PIP (0-100)    Long DIP (0-70)    Ring MCP (0-90)    Ring  PIP (0-100)    Ring DIP (0-70)    Little MCP (0-90)    Little PIP (0-100)    Little DIP (0-70)    (Blank rows = not tested)   HAND STRENGTH & FUNCTION: Eval: Observed weakness in affected Rt hand/arm, grossly 3-/5 MMT, but specific gripping and resistance training contraindicated today.   COORDINATION: Eval: Moderate observed coordination impairments with surgical hand, as seen by pain,stiffness, etc. details will be tested when appropriate  SENSATION: Eval:  Light touch mildly diminished especially through sx area.  She also has some significant pain  and hypersensitivity around the scar and through the hypothenar eminence.   EDEMA:   Eval:  Mildly swollen in surgical hand today.  Expected to improve with HEP and recommendations.   COGNITION: Eval: Overall cognitive status: WFL for evaluation today   OBSERVATIONS:   Eval: Surgical site is clean and no overt signs of infection, no drainage, signs of dehiscence, etc. Steri-Strips are clean and in place but she is very hypersensitive and tender to touch.  Rt CTR    TODAY'S TREATMENT:  Post-evaluation treatment:   The patient was given safety information for managing post-op wound, including not to soak wound, to keep clean and dry, to start with gentle scar mobilizations approx 3 days after stitches are removed and if the wound is closed. The patient should replace their wound dressing at least 1x daily, and monitor for signs of infection.  The patient was supplied with compressive gauze to help with swelling as needed.  The patient should contact the surgeon with any concerns immediately.   The patient should also avoid any strong gripping, push, pull, weight bearing or repetitive motion for the next month.  The patient  should not be doing painful activities.  The patient should not rest on their palm, keep the wrist bent for long time periods, or sleep on their hand. After a month, the patient can progressively return to all light, normal activities. Sports and heavy weight lifting should be withheld for a total of 3 months.   The patient was also educated (explanation and demonstration) on the following home exercise program including tolerable range of motion, gentle passive range of motion, scar care, progressive desensitization, prevention of soft tissue contractures, etc. The patient states understanding all directions     CTR Exercises  - Turn Palm Facing Up & Down  - 4 x daily - 15 reps - Bend and Pull Back Wrist SLOWLY  - 4 x daily - 15 reps - Tendon Glides  - 4 x daily - 5  reps - 3 second hold - Median Nerve Flossing  - 3 x daily - 5 reps - Wrist Prayer Stretch  - 4 x daily - 3 reps - 15 second hold - Full finger stretches   - 4 x daily - 3 reps - 15 second hold Patient Education - Scar Massage   PATIENT EDUCATION: Education details: See tx section above for details  Person educated: Patient Education method: Verbal Instruction, Teach back, Handouts  Education comprehension: States and demonstrates understanding, Additional Education required    HOME EXERCISE PROGRAM: See tx section above for details    GOALS: Goals reviewed with patient? Yes   SHORT TERM GOALS: (STG required if POC>30 days) Target Date: 11/21/23  Pt will obtain protective, custom orthotic. Goal status: TBD/PRN  2.  Pt will demo/state understanding of initial HEP to improve pain levels and prerequisite motion. Goal status: INITIAL   LONG TERM GOALS:  Target Date: 12/12/23  Pt will improve functional ability by decreased impairment per Quick DASH assessment from TBD to 10% or better, for better quality of life. Goal status: INITIAL  2.  Pt will improve grip strength in Rt hand from tender and contraindicated to at least 25 lbs for functional use at home and in IADLs. Goal status: INITIAL  3.  Pt will improve A/ROM in right wrist flexion/extension from approximately 30 degrees each to at least approximately 55 degrees each, to have functional motion for tasks like reach and grasp.  Goal status: INITIAL  4.  Pt will improve strength in right wrist flexion/extension from apparent 3 -/5 MMT to at least 4+/5 MMT to have increased functional ability to carry out selfcare and higher-level homecare tasks with less difficulty. Goal status: INITIAL  5.  Pt will improve coordination skills in right hand and arm, as seen by within functional limit score on nine-hole peg testing to have increased functional ability to carry out fine motor tasks (fasteners, etc.) and more complex,  coordinated IADLs (meal prep, sports, etc.).  Goal status: INITIAL  6.  Pt will decrease pain at rest from 5.5/10 to 2/10 or better to have better sleep and occupational participation in daily roles. Goal status: INITIAL   ASSESSMENT:  CLINICAL IMPRESSION: Patient is a 58 y.o. female who was seen today for occupational therapy evaluation for right carpal tunnel release surgery and subsequent stiffness, swelling, pain, hypersensitivity and decreased functional ability.  The patient will benefit from outpatient occupational therapy to decrease symptoms, improve functional upper extremity use, and increase quality of life.  PERFORMANCE DEFICITS: in functional skills including ADLs, IADLs, coordination, dexterity, proprioception, sensation, edema, ROM, strength, pain, fascial restrictions, flexibility, Fine motor control, Gross motor control, body mechanics, endurance, wound, and UE functional use, cognitive skills including problem solving and safety awareness, and psychosocial skills including coping strategies, environmental adaptation, habits, and routines and behaviors.   IMPAIRMENTS: are limiting patient from ADLs, IADLs, rest and sleep, and leisure.   COMORBIDITIES: may have co-morbidities  that affects occupational performance. Patient will benefit from skilled OT to address above impairments and improve overall function.  MODIFICATION OR ASSISTANCE TO COMPLETE EVALUATION: No modification of tasks or assist necessary to complete an evaluation.  OT OCCUPATIONAL PROFILE AND HISTORY: Problem focused assessment: Including review of records relating to presenting problem.  CLINICAL DECISION MAKING: Moderate - several treatment options, min-mod task modification necessary  REHAB POTENTIAL: Excellent  EVALUATION COMPLEXITY: Low      PLAN:  OT FREQUENCY: 1-2x/week  OT DURATION: 4 weeks through 12/12/23 and up to 8 total visits as needed   PLANNED INTERVENTIONS: 97535 self care/ADL  training, 02889 therapeutic exercise, 97530 therapeutic activity, 97112 neuromuscular re-education, 97140 manual therapy, 97035 ultrasound, 97032 electrical stimulation (manual), 97760 Orthotic Initial, [id number] Orthotic/Prosthetic subsequent, compression bandaging, Dry needling, energy conservation, coping strategies training, and patient/family education  RECOMMENDED OTHER SERVICES: none now    CONSULTED AND AGREED WITH PLAN OF CARE: Patient  PLAN FOR NEXT SESSION:   Review initial HEP and recommendations    Melvenia Ada, OTR/L, CHT  11/13/23

## 2023-11-13 ENCOUNTER — Ambulatory Visit (INDEPENDENT_AMBULATORY_CARE_PROVIDER_SITE_OTHER): Admitting: Orthopedic Surgery

## 2023-11-13 ENCOUNTER — Encounter: Payer: Self-pay | Admitting: Rehabilitative and Restorative Service Providers"

## 2023-11-13 ENCOUNTER — Ambulatory Visit: Admitting: Rehabilitative and Restorative Service Providers"

## 2023-11-13 DIAGNOSIS — R278 Other lack of coordination: Secondary | ICD-10-CM | POA: Diagnosis not present

## 2023-11-13 DIAGNOSIS — Z9889 Other specified postprocedural states: Secondary | ICD-10-CM

## 2023-11-13 DIAGNOSIS — M79641 Pain in right hand: Secondary | ICD-10-CM

## 2023-11-13 DIAGNOSIS — M6281 Muscle weakness (generalized): Secondary | ICD-10-CM

## 2023-11-13 DIAGNOSIS — R600 Localized edema: Secondary | ICD-10-CM | POA: Diagnosis not present

## 2023-11-13 DIAGNOSIS — R202 Paresthesia of skin: Secondary | ICD-10-CM

## 2023-11-13 NOTE — Progress Notes (Signed)
   Amanda Villanueva - 58 y.o. female MRN [id number]  Date of birth: 15-Nov-1965  Office Visit Note: Visit Date: 11/13/2023 PCP: Rosalea Rosina SAILOR, PA Referred by: Rosalea Rosina SAILOR, PA  Subjective:  HPI: Amanda Villanueva is a 58 y.o. female who presents today for follow up 2 weeks status post right open carpal tunnel release.  She has some ongoing pain but is doing well overall.  Numbness and tingling remains somewhat persistent, slightly improved.  Pertinent ROS were reviewed with the patient and found to be negative unless otherwise specified above in HPI.   Assessment & Plan: Visit Diagnoses:  1. S/P carpal tunnel release     Plan: Sutures removed today and Steri-Strips applied.  Should be seen by occupational therapy after our visit today to work on range of motion.  She is quite hesitant to move the digits at this juncture, I did emphasize the importance of therapy moving forward.  Follow-up with myself in 1 month to track progress.  Follow-up: No follow-ups on file.   Meds & Orders: No orders of the defined types were placed in this encounter.  No orders of the defined types were placed in this encounter.    Procedures: No procedures performed       Objective:   Vital Signs: LMP 05/11/2018   Ortho Exam Right hand: - Well-healing palmar incision, sutures removed, skin edges well-approximated without erythema or drainage - Digital range of motion somewhat limited, approximately 3 cm from palmar skin with attempted fist, improved passively with associated pain - Sensation remains slightly diminished in the median nerve distribution particular to the index and long finger - 5/5 APB without significant thenar atrophy   Imaging: No results found.   Alaisha Eversley Afton Alderton, M.D. [facility], Hand Surgery

## 2023-11-19 NOTE — Therapy (Incomplete)
 OUTPATIENT OCCUPATIONAL THERAPY TREATMENT NOTE  Patient Name: Amanda Villanueva MRN: [id number] DOB:11/22/1965, 58 y.o., female Today's Date: 11/19/2023  REFERRING PROVIDER: Arlinda Buster, MD   END OF SESSION:   Past Medical History:  Diagnosis Date   Asthma    Past Surgical History:  Procedure Laterality Date   ABDOMINAL HYSTERECTOMY     CARPAL TUNNEL RELEASE Right 10/31/2023   Procedure: CARPAL TUNNEL RELEASE;  Surgeon: Arlinda Buster, MD;  Location: [facility];  Service: Orthopedics;  Laterality: Right;  RIGHT OPEN CARPAL TUNNEL RELEASE   KNEE SURGERY     bilateral   TUBAL LIGATION     Patient Active Problem List   Diagnosis Date Noted   Lumbar radiculopathy 09/12/2022   Ganglion cyst of dorsum of right wrist 01/10/2022   Diverticulosis 09/21/2021   Hemangioma of intra-abdominal structure 09/21/2021   Asthma 07/25/2020   OA (osteoarthritis) of knee 09/30/2019   Degenerative disc disease at L5-S1 level 09/21/2019   Piriformis syndrome of left side 07/13/2019   Carpal tunnel syndrome, right upper limb 07/13/2019   Seasonal allergic conjunctivitis 11/30/2015   Moderate persistent asthma 10/18/2015   Seasonal and perennial allergic rhinitis 10/18/2015     ONSET DATE:  DOS 10/31/23  REFERRING DIAG: G56.01 (ICD-10-CM) - Carpal tunnel syndrome, right upper limb   THERAPY DIAG:     No diagnosis found.  Rationale for Evaluation and Treatment: Rehabilitation  PERTINENT HISTORY: The patient is now approx 2 weeks s/p Rt hand CTR.  The patient states hx of paresthesia and pain in their right hand and subsequent surgical release of the carpal tunnel. Now the patient states having some lingering paresthesia along with hypersensitivity, stiffness, pain, decreased ability to make a fist and perform I/ADLs.    PRECAUTIONS: None relative to this evaluation and episode of care.   RED FLAGS: None   WEIGHT BEARING RESTRICTIONS: Yes: caution with weightbearing  for the next 4-6 weeks, recommended less than 5lbs for next 2 weeks with affected hand   SUBJECTIVE:   SUBJECTIVE STATEMENT: She states ***.      PAIN:  Are you having pain? Yes: NPRS scale: *** 5.5/10 at rest right now (while on Tylenol ) Pain location:  sx area Pain description: aching and sore Aggravating factors: gripping/squeezing Relieving factors: rest   PATIENT GOALS: To improve motion, function with affected surgical hand  NEXT MD VISIT: PRN    OBJECTIVE MEASURES:   ADLs: Overall ADLs: States decreased ability to grab, hold household objects, pain and difficulty to open containers, perform FMS tasks (manipulate fasteners on clothing).     UPPER EXTREMITY ROM:     A/ROM Right eval Left eval Rt 11/20/23  Wrist flexion 31 70 ***  Wrist extension 28 59 ***  (Blank rows = not tested)                    Hand A/ROM Right eval Rt 11/20/23  Full Fist Ability (or Gap to Distal Palmar Crease) Unable due to stiffness/soreness   Thumb Opposition  (Kapandji Scale)  3/10 ***/10  Thumb MCP (0-60)    Thumb IP (0-80)    Index MCP (0-90)    Index PIP (0-100)    Index DIP (0-70)     Long MCP (0-90)     Long PIP (0-100)     Long DIP (0-70)     Ring MCP (0-90)     Ring PIP (0-100)     Ring DIP (0-70)     Little  MCP (0-90)     Little PIP (0-100)     Little DIP (0-70)     (Blank rows = not tested)   HAND STRENGTH & FUNCTION: Eval: Observed weakness in affected Rt hand/arm, grossly 3-/5 MMT, but specific gripping and resistance training contraindicated today.   COORDINATION: Eval: Moderate observed coordination impairments with surgical hand, as seen by pain,stiffness, etc. details will be tested when appropriate  SENSATION: Eval:  Light touch mildly diminished especially through sx area.  She also has some significant pain and hypersensitivity around the scar and through the hypothenar eminence.   EDEMA:   Eval:  Mildly swollen in surgical hand today.   Expected to improve with HEP and recommendations.   OBSERVATIONS:   Eval: Surgical site is clean and no overt signs of infection, no drainage, signs of dehiscence, etc. Steri-Strips are clean and in place but she is very hypersensitive and tender to touch.  Rt CTR    TODAY'S TREATMENT:  11/20/23: *** V2   :)    Post-evaluation treatment:   The patient was given safety information for managing post-op wound, including not to soak wound, to keep clean and dry, to start with gentle scar mobilizations approx 3 days after stitches are removed and if the wound is closed. The patient should replace their wound dressing at least 1x daily, and monitor for signs of infection.  The patient was supplied with compressive gauze to help with swelling as needed.  The patient should contact the surgeon with any concerns immediately.   The patient should also avoid any strong gripping, push, pull, weight bearing or repetitive motion for the next month.  The patient  should not be doing painful activities.  The patient should not rest on their palm, keep the wrist bent for long time periods, or sleep on their hand. After a month, the patient can progressively return to all light, normal activities. Sports and heavy weight lifting should be withheld for a total of 3 months.   The patient was also educated (explanation and demonstration) on the following home exercise program including tolerable range of motion, gentle passive range of motion, scar care, progressive desensitization, prevention of soft tissue contractures, etc. The patient states understanding all directions     CTR Exercises  - Turn Palm Facing Up & Down  - 4 x daily - 15 reps - Bend and Pull Back Wrist SLOWLY  - 4 x daily - 15 reps - Tendon Glides  - 4 x daily - 5 reps - 3 second hold - Median Nerve Flossing  - 3 x daily - 5 reps - Wrist Prayer Stretch  - 4 x daily - 3 reps - 15 second hold - Full finger stretches   - 4 x daily - 3 reps - 15  second hold Patient Education - Scar Massage   PATIENT EDUCATION: Education details: See tx section above for details  Person educated: Patient Education method: Verbal Instruction, Teach back, Handouts  Education comprehension: States and demonstrates understanding, Additional Education required    HOME EXERCISE PROGRAM: See tx section above for details    GOALS: Goals reviewed with patient? Yes   SHORT TERM GOALS: (STG required if POC>30 days) Target Date: 11/21/23  Pt will obtain protective, custom orthotic. Goal status: TBD/PRN  2.  Pt will demo/state understanding of initial HEP to improve pain levels and prerequisite motion. Goal status: INITIAL   LONG TERM GOALS: Target Date: 12/12/23  Pt will improve functional ability by decreased  impairment per Quick DASH assessment from TBD to 10% or better, for better quality of life. Goal status: INITIAL  2.  Pt will improve grip strength in Rt hand from tender and contraindicated to at least 25 lbs for functional use at home and in IADLs. Goal status: INITIAL  3.  Pt will improve A/ROM in right wrist flexion/extension from approximately 30 degrees each to at least approximately 55 degrees each, to have functional motion for tasks like reach and grasp.  Goal status: INITIAL  4.  Pt will improve strength in right wrist flexion/extension from apparent 3 -/5 MMT to at least 4+/5 MMT to have increased functional ability to carry out selfcare and higher-level homecare tasks with less difficulty. Goal status: INITIAL  5.  Pt will improve coordination skills in right hand and arm, as seen by within functional limit score on nine-hole peg testing to have increased functional ability to carry out fine motor tasks (fasteners, etc.) and more complex, coordinated IADLs (meal prep, sports, etc.).  Goal status: INITIAL  6.  Pt will decrease pain at rest from 5.5/10 to 2/10 or better to have better sleep and occupational participation in  daily roles. Goal status: INITIAL   ASSESSMENT:  CLINICAL IMPRESSION: 11/20/23: ***   Eval: Patient is a 58 y.o. female who was seen today for occupational therapy evaluation for right carpal tunnel release surgery and subsequent stiffness, swelling, pain, hypersensitivity and decreased functional ability.  The patient will benefit from outpatient occupational therapy to decrease symptoms, improve functional upper extremity use, and increase quality of life.      PLAN:  OT FREQUENCY: 1-2x/week  OT DURATION: 4 weeks through 12/12/23 and up to 8 total visits as needed   PLANNED INTERVENTIONS: 97535 self care/ADL training, 02889 therapeutic exercise, 97530 therapeutic activity, 97112 neuromuscular re-education, 97140 manual therapy, 97035 ultrasound, 97032 electrical stimulation (manual), 97760 Orthotic Initial, [id number] Orthotic/Prosthetic subsequent, compression bandaging, Dry needling, energy conservation, coping strategies training, and patient/family education  CONSULTED AND AGREED WITH PLAN OF CARE: Patient  PLAN FOR NEXT SESSION:   ***   Melvenia Ada, OTR/L, CHT  11/13/23

## 2023-11-20 ENCOUNTER — Encounter: Admitting: Rehabilitative and Restorative Service Providers"

## 2023-11-27 NOTE — Therapy (Signed)
 OUTPATIENT OCCUPATIONAL THERAPY TREATMENT NOTE  Patient Name: Amanda Villanueva MRN: [id number] DOB:[birth year], 58 y.o., female Today's Date: 12/01/2023  REFERRING PROVIDER: Arlinda Buster, MD   END OF SESSION:   Past Medical History:  Diagnosis Date   Asthma    Past Surgical History:  Procedure Laterality Date   ABDOMINAL HYSTERECTOMY     CARPAL TUNNEL RELEASE Right 10/31/2023   Procedure: CARPAL TUNNEL RELEASE;  Surgeon: Arlinda Buster, MD;  Location: [facility];  Service: Orthopedics;  Laterality: Right;  RIGHT OPEN CARPAL TUNNEL RELEASE   KNEE SURGERY     bilateral   TUBAL LIGATION     Patient Active Problem List   Diagnosis Date Noted   Lumbar radiculopathy 09/12/2022   Ganglion cyst of dorsum of right wrist 01/10/2022   Diverticulosis 09/21/2021   Hemangioma of intra-abdominal structure 09/21/2021   Asthma 07/25/2020   OA (osteoarthritis) of knee 09/30/2019   Degenerative disc disease at L5-S1 level 09/21/2019   Piriformis syndrome of left side 07/13/2019   Carpal tunnel syndrome, right upper limb 07/13/2019   Seasonal allergic conjunctivitis 11/30/2015   Moderate persistent asthma 10/18/2015   Seasonal and perennial allergic rhinitis 10/18/2015     ONSET DATE:  DOS 10/31/23  REFERRING DIAG: G56.01 (ICD-10-CM) - Carpal tunnel syndrome, right upper limb   THERAPY DIAG:     Localized edema  Muscle weakness (generalized)  Other lack of coordination  Pain in right hand  Paresthesia of skin  Rationale for Evaluation and Treatment: Rehabilitation  PERTINENT HISTORY:  The patient states hx of paresthesia and pain in their right hand and subsequent surgical release of the carpal tunnel. Now the patient states having some lingering paresthesia along with hypersensitivity, stiffness, pain, decreased ability to make a fist and perform I/ADLs.    PRECAUTIONS: None relative to this evaluation and episode of care.   RED FLAGS: None   WEIGHT  BEARING RESTRICTIONS: Yes: caution with weightbearing for the next 4-6 weeks, recommended less than 5lbs for next 2 weeks with affected hand   SUBJECTIVE:   SUBJECTIVE STATEMENT: Patient is now approx 4 weeks s/p Rt hand CTR.   She states her pain is less, she is less hypersensitive, and she has been avoiding some exercises that are more strenuous to her.  She is having slight difficulty sleeping.      PAIN:  Are you having pain? Yes: NPRS scale: 4.5/10 at rest right now (while on Tylenol ) Pain location:  sx area Pain description: aching and sore Aggravating factors: gripping/squeezing Relieving factors: rest   PATIENT GOALS: To improve motion, function with affected surgical hand  NEXT MD VISIT: PRN    OBJECTIVE MEASURES:   ADLs: Overall ADLs: States decreased ability to grab, hold household objects, pain and difficulty to open containers, perform FMS tasks (manipulate fasteners on clothing).     UPPER EXTREMITY ROM:     A/ROM Right eval Left eval Rt 12/01/23  Wrist flexion 31 70 50  Wrist extension 28 59 33  (Blank rows = not tested)                    Hand A/ROM Right eval Rt 12/01/23  Full Fist Ability (or Gap to Distal Palmar Crease) Unable due to stiffness/soreness 3.4cm gap from tip of MF to [facility]   Thumb Opposition  (Kapandji Scale)  3/10 4/10  Thumb MCP (0-60)    Thumb IP (0-80)    Index MCP (0-90)    Index PIP (0-100)  Index DIP (0-70)     Long MCP (0-90)     Long PIP (0-100)     Long DIP (0-70)     Ring MCP (0-90)     Ring PIP (0-100)     Ring DIP (0-70)     Little MCP (0-90)     Little PIP (0-100)     Little DIP (0-70)     (Blank rows = not tested)   HAND STRENGTH & FUNCTION: Eval: Observed weakness in affected Rt hand/arm, grossly 3-/5 MMT, but specific gripping and resistance training contraindicated today.   COORDINATION: Eval: Moderate observed coordination impairments with surgical hand, as seen by pain,stiffness, etc. details will  be tested when appropriate  SENSATION: Eval:  Light touch mildly diminished especially through sx area.  She also has some significant pain and hypersensitivity around the scar and through the hypothenar eminence.   EDEMA:   Eval:  Mildly swollen in surgical hand today.  Expected to improve with HEP and recommendations.   OBSERVATIONS:   Eval: Surgical site is clean and no overt signs of infection, no drainage, signs of dehiscence, etc. Steri-Strips are clean and in place but she is very hypersensitive and tender to touch.  Rt CTR    TODAY'S TREATMENT:  12/01/23: She starts with active range of motion for exercise as well as new measures which does show improvements.  To help her sleep at night and to help her scar heal, OT supplies her with a silicone scar pad and a compressive Tubigrip to wear over top and day or night, but was recommended to wear in the night.   We reviewed her home exercises as listed below, and OT ensures that she is performing nerve gliding, modified prayer stretching as well as finger stretches.  She had been avoiding these as they were too tender for her originally.  She still has some hypersensitivity and guarding, and she was educated to try to let her hand relax, let her hand swing when she is walking and not be so guarded.  She actually states this makes her feel somewhat better.  Again we reviewed desensitization with vibration as well as light touch, using Vaseline over her scar several times a day to prevent her from looking dry.  She should try to not tip the scales by pushing herself too much and ending up in more pain, causing more nervous inhibition.  She states understanding all directions, tolerates all exercises fairly well today and leaves in no significant increase in pain.   Exercises - Turn [employer] Facing Up & Down  - 4 x daily - 15 reps - Bend and Pull Back Wrist SLOWLY  - 4 x daily - 15 reps - Tendon Glides  - 4 x daily - 5 reps - 3 second hold -  Median Nerve Flossing  - 3 x daily - 5 reps - Wrist Prayer Stretch  - 4 x daily - 3 reps - 15 second hold - Full finger stretches   - 4 x daily - 3 reps - 15 second hold  Patient Education - Scar Massage   PATIENT EDUCATION: Education details: See tx section above for details  Person educated: Patient Education method: Verbal Instruction, Teach back, Handouts  Education comprehension: States and demonstrates understanding, Additional Education required    HOME EXERCISE PROGRAM: Access Code: [email or web address] URL: [email or web address] Date: 12/01/2023 Prepared by: Melvenia Ada    GOALS: Goals reviewed with patient? Yes   SHORT TERM GOALS: (STG required if POC>30  days) Target Date: 11/21/23  Pt will obtain protective, custom orthotic. Goal status: TBD/PRN  2.  Pt will demo/state understanding of initial HEP to improve pain levels and prerequisite motion. Goal status: 12/01/2023: Met  LONG TERM GOALS: Target Date: 12/12/23  Pt will improve functional ability by decreased impairment per Quick DASH assessment from TBD to 10% or better, for better quality of life. Goal status: INITIAL  2.  Pt will improve grip strength in Rt hand from tender and contraindicated to at least 25 lbs for functional use at home and in IADLs. Goal status: INITIAL  3.  Pt will improve A/ROM in right wrist flexion/extension from approximately 30 degrees each to at least approximately 55 degrees each, to have functional motion for tasks like reach and grasp.  Goal status: INITIAL  4.  Pt will improve strength in right wrist flexion/extension from apparent 3 -/5 MMT to at least 4+/5 MMT to have increased functional ability to carry out selfcare and higher-level homecare tasks with less difficulty. Goal status: INITIAL  5.  Pt will improve coordination skills in right hand and arm, as seen by within functional limit score on nine-hole peg testing to have increased functional ability to carry  out fine motor tasks (fasteners, etc.) and more complex, coordinated IADLs (meal prep, sports, etc.).  Goal status: INITIAL  6.  Pt will decrease pain at rest from 5.5/10 to 2/10 or better to have better sleep and occupational participation in daily roles. Goal status: INITIAL   ASSESSMENT:  CLINICAL IMPRESSION: 12/01/23: Now tolerating all home exercises initially given out.  Now less hypersensitive but still has some guarding and apprehension.  Eval: Patient is a 58 y.o. female who was seen today for occupational therapy evaluation for right carpal tunnel release surgery and subsequent stiffness, swelling, pain, hypersensitivity and decreased functional ability.  The patient will benefit from outpatient occupational therapy to decrease symptoms, improve functional upper extremity use, and increase quality of life.      PLAN:  OT FREQUENCY: 1-2x/week  OT DURATION: 4 weeks through 12/12/23 and up to 8 total visits as needed   PLANNED INTERVENTIONS: 97535 self care/ADL training, 02889 therapeutic exercise, 97530 therapeutic activity, 97112 neuromuscular re-education, 97140 manual therapy, 97035 ultrasound, 97032 electrical stimulation (manual), 97760 Orthotic Initial, [id number] Orthotic/Prosthetic subsequent, compression bandaging, Dry needling, energy conservation, coping strategies training, and patient/family education  CONSULTED AND AGREED WITH PLAN OF CARE: Patient  PLAN FOR NEXT SESSION:   Check silicone scar pad, check motion and tolerance to touch and activity.  Try fluidotherapy when wound looks completely healed.   Melvenia Ada, OTR/L, CHT  11/13/23

## 2023-12-01 ENCOUNTER — Ambulatory Visit (INDEPENDENT_AMBULATORY_CARE_PROVIDER_SITE_OTHER): Admitting: Rehabilitative and Restorative Service Providers"

## 2023-12-01 ENCOUNTER — Encounter: Payer: Self-pay | Admitting: Rehabilitative and Restorative Service Providers"

## 2023-12-01 DIAGNOSIS — R278 Other lack of coordination: Secondary | ICD-10-CM | POA: Diagnosis not present

## 2023-12-01 DIAGNOSIS — R202 Paresthesia of skin: Secondary | ICD-10-CM

## 2023-12-01 DIAGNOSIS — M6281 Muscle weakness (generalized): Secondary | ICD-10-CM

## 2023-12-01 DIAGNOSIS — M79641 Pain in right hand: Secondary | ICD-10-CM | POA: Diagnosis not present

## 2023-12-01 DIAGNOSIS — R600 Localized edema: Secondary | ICD-10-CM | POA: Diagnosis not present

## 2023-12-04 NOTE — Therapy (Addendum)
 OUTPATIENT OCCUPATIONAL THERAPY TREATMENT & PROGRESS NOTE  Patient Name: Amanda Villanueva MRN: [id number] DOB:[birth year], 58 y.o., female Today's Date: 12/08/2023  REFERRING PROVIDER: Arlinda Buster, MD               Progress Note Reporting Period 11/13/23 to 12/08/23  CLINICAL IMPRESSION: 12/08/23: She has now met her short-term goals and one of her long-term goals.  She still has some lingering hypersensitivity, fear, weakness, stiffness, though these things have been improving.  We were only able to meet 3 times over the past month in therapy in total due to some missed sessions.  OT feels that she would benefit from continued therapy services to help her reach her long-term goals.  She is in agreement and we will ask for additional authorization today.    PLAN:  OT FREQUENCY: 1-2x/week  OT DURATION: 6 additional weeks 12/12/23 - 01/23/24 and up to 9 total visits as needed   See note below for Objective Data and Assessment of Progress/Goals.                         END OF SESSION:   Past Medical History:  Diagnosis Date   Asthma    Past Surgical History:  Procedure Laterality Date   ABDOMINAL HYSTERECTOMY     CARPAL TUNNEL RELEASE Right 10/31/2023   Procedure: CARPAL TUNNEL RELEASE;  Surgeon: Arlinda Buster, MD;  Location: [facility];  Service: Orthopedics;  Laterality: Right;  RIGHT OPEN CARPAL TUNNEL RELEASE   KNEE SURGERY     bilateral   TUBAL LIGATION     Patient Active Problem List   Diagnosis Date Noted   Lumbar radiculopathy 09/12/2022   Ganglion cyst of dorsum of right wrist 01/10/2022   Diverticulosis 09/21/2021   Hemangioma of intra-abdominal structure 09/21/2021   Asthma 07/25/2020   OA (osteoarthritis) of knee 09/30/2019   Degenerative disc disease at L5-S1 level 09/21/2019   Piriformis syndrome of left side 07/13/2019   Carpal tunnel syndrome, right upper limb 07/13/2019   Seasonal allergic conjunctivitis  11/30/2015   Moderate persistent asthma 10/18/2015   Seasonal and perennial allergic rhinitis 10/18/2015     ONSET DATE:  DOS 10/31/23  REFERRING DIAG: G56.01 (ICD-10-CM) - Carpal tunnel syndrome, right upper limb   THERAPY DIAG:     Localized edema  Muscle weakness (generalized)  Other lack of coordination  Pain in right hand  Paresthesia of skin  Rationale for Evaluation and Treatment: Rehabilitation  PERTINENT HISTORY:  The patient states hx of paresthesia and pain in their right hand and subsequent surgical release of the carpal tunnel. Now the patient states having some lingering paresthesia along with hypersensitivity, stiffness, pain, decreased ability to make a fist and perform I/ADLs.    PRECAUTIONS: None relative to this evaluation and episode of care.   RED FLAGS: None   WEIGHT BEARING RESTRICTIONS: Yes: caution with weightbearing for the next 4-6 weeks, recommended less than 5lbs for next 2 weeks with affected hand   SUBJECTIVE:   SUBJECTIVE STATEMENT: Patient is now approx 5 weeks s/p Rt hand CTR.   She states she feels like she is having less pain-not at rest.  She is also moving a bit better though she still does have some stiffness and weakness.  She still cannot fully make a fist.     PAIN:  Are you having pain? Yes: NPRS scale: 0/10 at rest right now  Pain location:  sx area Pain description: aching  and sore Aggravating factors: gripping/squeezing Relieving factors: rest   PATIENT GOALS: To improve motion, function with affected surgical hand  NEXT MD VISIT: PRN    OBJECTIVE MEASURES:   12/08/23: Quick DASH: 64% impairment today   ADLs: Overall ADLs: States decreased ability to grab, hold household objects, pain and difficulty to open containers, perform FMS tasks (manipulate fasteners on clothing).     UPPER EXTREMITY ROM:     A/ROM Right eval Left eval Rt 12/01/23 Rt 12/08/23  Wrist flexion 31 70 50 35  Wrist extension 28 59 33 49   (Blank rows = not tested)                    Hand A/ROM Right eval Rt 12/01/23 Rt 12/08/23  Full Fist Ability (or Gap to Distal Palmar Crease) Unable due to stiffness/soreness 3.4cm gap from tip of MF to [facility]  3.0 cm gap  Thumb Opposition  (Kapandji Scale)  3/10 4/10 6/10   (Blank rows = not tested)   HAND STRENGTH & FUNCTION: 12/08/23: Grip Rt: 5.5#, Lt: 43.5#    COORDINATION: 12/08/23: 9HPT: Rt  32sec today (22sec  is WFL)     Eval: Moderate observed coordination impairments with surgical hand, as seen by pain,stiffness, etc. details will be tested when appropriate  SENSATION: Eval:  Light touch mildly diminished especially through sx area.  She also has some significant pain and hypersensitivity around the scar and through the hypothenar eminence.   EDEMA:   Eval:  Mildly swollen in surgical hand today.  Expected to improve with HEP and recommendations.   OBSERVATIONS:   Eval: Surgical site is clean and no overt signs of infection, no drainage, signs of dehiscence, etc. Steri-Strips are clean and in place but she is very hypersensitive and tender to touch.  Rt CTR    TODAY'S TREATMENT:  12/08/23:  Pt performs AROM, gripping, and strength with right hand and wrist against therapist's resistance for exercise/activities as well as new measures today. OT also discusses home and functional tasks with the pt and reviews goals. Using the complied data, OT also reviews home exercises and provides updated recommendations and upgrades as bolded below.  No therapy putty activities were recommended to start in 1 to 2 weeks, as she will not be able to see the therapist due to a vacation.  She should get into these carefully and slowly after warming up and stretching first.  Today she also uses fluidotherapy for desensitization which she finds takes all of her pain away at least for a time.  OT educates on performing contrast bath technique herself at home which she should also find helpful for  hypersensitivity and swelling.  Pt states understanding and tolerates upgrades well.  She agrees that she requires additional therapy to achieve all of her goals.    Exercises educated on/reviewed today: - Insurance account manager Up & Down  - 4 x daily - 15 reps - Bend and Pull Back Wrist SLOWLY  - 4 x daily - 15 reps - Tendon Glides  - 4 x daily - 5 reps - 3 second hold - Median Nerve Flossing  - 3 x daily - 5 reps - Wrist Prayer Stretch  - 4 x daily - 3 reps - 15 second hold - Wrist Flexion Stretch  - 4 x daily - 3-5 reps - 15 sec hold - Full finger stretches   - 4 x daily - 3 reps - 15 second hold - Full Fist  -  2-3 x daily - 5 reps - Finger Extension Pizza!   - 2-3 x daily - 5 reps - Thumb Opposition with Putty  - 2-3 x daily - 5 reps Patient Education - Scar Massage   PATIENT EDUCATION: Education details: See tx section above for details  Person educated: Patient Education method: Verbal Instruction, Teach back, Handouts  Education comprehension: States and demonstrates understanding, Additional Education required    HOME EXERCISE PROGRAM: Access Code: [email or web address] URL: [email or web address] Date: 12/01/2023 Prepared by: Melvenia Ada    GOALS: Goals reviewed with patient? Yes   SHORT TERM GOALS: (STG required if POC>30 days) Target Date: 11/21/23  Pt will obtain protective, custom orthotic. Goal status: N/A/discharge goal  2.  Pt will demo/state understanding of initial HEP to improve pain levels and prerequisite motion. Goal status: 12/01/2023: Met  LONG TERM GOALS: Target Date: 01/23/24  Pt will improve functional ability by decreased impairment per Quick DASH assessment from 64% to 25% or better, for better quality of life. Goal status: 12/08/23: INITIAL today   2.  Pt will improve grip strength in Rt hand from tender and contraindicated to at least 25 lbs for functional use at home and in IADLs. Goal status: 12/08/23: 5.5# now  3.  Pt will improve  A/ROM in right wrist flexion/extension from approximately 30 degrees each to at least approximately 55 degrees each, to have functional motion for tasks like reach and grasp.  Goal status: 12/08/23: improving   4.  Pt will improve strength in right wrist flexion/extension from apparent 3 -/5 MMT to at least 4+/5 MMT to have increased functional ability to carry out selfcare and higher-level homecare tasks with less difficulty. Goal status: 12/08/23: Not quite tolerated yet  5.  Pt will improve coordination skills in right hand and arm, as seen by within functional limit score on nine-hole peg testing to have increased functional ability to carry out fine motor tasks (fasteners, etc.) and more complex, coordinated IADLs (meal prep, sports, etc.).  Goal status: 12/08/23: improving   6.  Pt will decrease pain at rest from 5.5/10 to 2/10 or better to have better sleep and occupational participation in daily roles. Goal status:  12/08/23: MET    ASSESSMENT:  CLINICAL IMPRESSION: 12/08/23: She has now met her short-term goals and one of her long-term goals.  She still has some lingering hypersensitivity, fear, weakness, stiffness, though these things have been improving.  We were only able to meet 3 times over the past month in therapy in total due to some missed sessions.  OT feels that she would benefit from continued therapy services to help her reach her long-term goals.  She is in agreement and we will ask for additional authorization today.    PLAN:  OT FREQUENCY: 1-2x/week  OT DURATION: 6 additional weeks 12/12/23 - 01/23/24 and up to 9 total visits as needed   PLANNED INTERVENTIONS: 97535 self care/ADL training, 02889 therapeutic exercise, 97530 therapeutic activity, 97112 neuromuscular re-education, 97140 manual therapy, 97035 ultrasound, 97032 electrical stimulation (manual), 97760 Orthotic Initial, 97763 Orthotic/Prosthetic subsequent, compression bandaging, Dry needling, energy conservation,  coping strategies training, and patient/family education  CONSULTED AND AGREED WITH PLAN OF CARE: Patient  PLAN FOR NEXT SESSION:   Follow-up with her in approximately 2 and half weeks after vacation to check her status, check therapy putty activities, motion, strength etc.   Melvenia Ada, OTR/L, CHT  11/13/23

## 2023-12-08 ENCOUNTER — Ambulatory Visit (INDEPENDENT_AMBULATORY_CARE_PROVIDER_SITE_OTHER): Admitting: Rehabilitative and Restorative Service Providers"

## 2023-12-08 ENCOUNTER — Encounter: Payer: Self-pay | Admitting: Rehabilitative and Restorative Service Providers"

## 2023-12-08 DIAGNOSIS — M79641 Pain in right hand: Secondary | ICD-10-CM | POA: Diagnosis not present

## 2023-12-08 DIAGNOSIS — R278 Other lack of coordination: Secondary | ICD-10-CM

## 2023-12-08 DIAGNOSIS — R600 Localized edema: Secondary | ICD-10-CM | POA: Diagnosis not present

## 2023-12-08 DIAGNOSIS — R202 Paresthesia of skin: Secondary | ICD-10-CM

## 2023-12-08 DIAGNOSIS — M6281 Muscle weakness (generalized): Secondary | ICD-10-CM

## 2023-12-08 NOTE — Patient Instructions (Signed)
 Try "Contrast Bath" for hypersensitivity and swelling!   Two pitchers- fill one with piping hot water (check with opposite hand to ensure it's not too hot to burn you)   -Fill one with icy cold water   Start in heat for 40sec.[name] switch to ice (this will be difficult, do your best!)  for 10 seconds.   Switch back to heat, then ice, then heat. etc. for 5 cycles.     End on 10 sec of HEAT.  You should feel looser, better,  etc. normal to have some tingling at end.. your nerves were just worked. take a break!

## 2023-12-08 NOTE — Addendum Note (Signed)
 Addended by: GEORGINA LIMES D on: 12/08/2023 05:02 PM   Modules accepted: Orders

## 2023-12-09 ENCOUNTER — Ambulatory Visit (INDEPENDENT_AMBULATORY_CARE_PROVIDER_SITE_OTHER): Admitting: Orthopedic Surgery

## 2023-12-09 DIAGNOSIS — Z9889 Other specified postprocedural states: Secondary | ICD-10-CM

## 2023-12-09 NOTE — Progress Notes (Signed)
   Amanda Villanueva - 58 y.o. female MRN [id number]  Date of birth: March 08, 1966  Office Visit Note: Visit Date: 12/09/2023 PCP: Rosalea Rosina SAILOR, PA Referred by: Rosalea Rosina SAILOR, PA  Subjective:  HPI: Amanda Villanueva is a 58 y.o. female who presents today for follow up 5 weeks status post right open carpal tunnel release.  She is doing well overall, states that her numbness and tingling has improved significantly, is improving with range of motion with occupational therapy.  Pertinent ROS were reviewed with the patient and found to be negative unless otherwise specified above in HPI.   Assessment & Plan: Visit Diagnoses:  1. S/P carpal tunnel release     Plan: She is doing well postoperatively, she should continue with occupational therapy exercises as instructed with transition to home exercise program when appropriate.  She can follow-up with myself in approximate 6 weeks time for recheck.  Follow-up: No follow-ups on file.   Meds & Orders: No orders of the defined types were placed in this encounter.  No orders of the defined types were placed in this encounter.    Procedures: No procedures performed       Objective:   Vital Signs: LMP 05/11/2018   Ortho Exam Right hand: - Well-healing palmar incision, skin edges well-approximated without erythema or drainage - Near composite fist without restriction - Sensation intact light touch in median nerve distribution - 5/5 APB no thenar atrophy, thumb opposition ring finger DPC   Imaging: No results found.   Millie Forde Afton [place], M.D. [facility], Hand Surgery

## 2023-12-11 NOTE — Therapy (Incomplete)
 OUTPATIENT OCCUPATIONAL THERAPY TREATMENT NOTE  Patient Name: Amanda Villanueva MRN: [id number] DOB:[birth year], 58 y.o., female Today's Date: 12/11/2023  REFERRING PROVIDER: Arlinda Buster, MD     END OF SESSION:   Past Medical History:  Diagnosis Date   Asthma    Past Surgical History:  Procedure Laterality Date   ABDOMINAL HYSTERECTOMY     CARPAL TUNNEL RELEASE Right 10/31/2023   Procedure: CARPAL TUNNEL RELEASE;  Surgeon: Arlinda Buster, MD;  Location: [facility];  Service: Orthopedics;  Laterality: Right;  RIGHT OPEN CARPAL TUNNEL RELEASE   KNEE SURGERY     bilateral   TUBAL LIGATION     Patient Active Problem List   Diagnosis Date Noted   Lumbar radiculopathy 09/12/2022   Ganglion cyst of dorsum of right wrist 01/10/2022   Diverticulosis 09/21/2021   Hemangioma of intra-abdominal structure 09/21/2021   Asthma 07/25/2020   OA (osteoarthritis) of knee 09/30/2019   Degenerative disc disease at L5-S1 level 09/21/2019   Piriformis syndrome of left side 07/13/2019   Carpal tunnel syndrome, right upper limb 07/13/2019   Seasonal allergic conjunctivitis 11/30/2015   Moderate persistent asthma 10/18/2015   Seasonal and perennial allergic rhinitis 10/18/2015     ONSET DATE:  DOS 10/31/23  REFERRING DIAG: G56.01 (ICD-10-CM) - Carpal tunnel syndrome, right upper limb   THERAPY DIAG:     No diagnosis found.  Rationale for Evaluation and Treatment: Rehabilitation  PERTINENT HISTORY:  The patient states hx of paresthesia and pain in their right hand and subsequent surgical release of the carpal tunnel. Now the patient states having some lingering paresthesia along with hypersensitivity, stiffness, pain, decreased ability to make a fist and perform I/ADLs.    PRECAUTIONS: None relative to this evaluation and episode of care.   RED FLAGS: None   WEIGHT BEARING RESTRICTIONS: Yes: caution with weightbearing for the next 4-6 weeks, recommended less than  5lbs for next 2 weeks with affected hand   SUBJECTIVE:   SUBJECTIVE STATEMENT: Patient is now approx 6 weeks s/p Rt hand CTR.   She states ***     PAIN:  Are you having pain? Yes: NPRS scale: *** 0/10 at rest right now  Pain location:  sx area Pain description: aching and sore Aggravating factors: gripping/squeezing Relieving factors: rest   PATIENT GOALS: To improve motion, function with affected surgical hand  NEXT MD VISIT: PRN    OBJECTIVE MEASURES:   12/08/23: Quick DASH: 64% impairment today   ADLs: Overall ADLs: States decreased ability to grab, hold household objects, pain and difficulty to open containers, perform FMS tasks (manipulate fasteners on clothing).     UPPER EXTREMITY ROM:     A/ROM Right eval Left eval Rt 12/01/23 Rt 12/08/23 Rt 12/15/23  Wrist flexion 31 70 50 35 ***  Wrist extension 28 59 33 49 ***  (Blank rows = not tested)                    Hand A/ROM Right eval Rt 12/01/23 Rt 12/08/23  Full Fist Ability (or Gap to Distal Palmar Crease) Unable due to stiffness/soreness 3.4cm gap from tip of MF to [facility]  3.0 cm gap  Thumb Opposition  (Kapandji Scale)  3/10 4/10 6/10   (Blank rows = not tested)   HAND STRENGTH & FUNCTION: 12/15/23: Grip Rt: ***#    12/08/23: Grip Rt: 5.5#, Lt: 43.5#    COORDINATION: 12/08/23: 9HPT: Rt  32sec today (22sec  is WFL)     Eval:  Moderate observed coordination impairments with surgical hand, as seen by pain,stiffness, etc. details will be tested when appropriate  SENSATION: Eval:  Light touch mildly diminished especially through sx area.  She also has some significant pain and hypersensitivity around the scar and through the hypothenar eminence.   EDEMA:   Eval:  Mildly swollen in surgical hand today.  Expected to improve with HEP and recommendations.   OBSERVATIONS:   Eval: Surgical site is clean and no overt signs of infection, no drainage, signs of dehiscence, etc. Steri-Strips are clean and in place but  she is very hypersensitive and tender to touch.  Rt CTR    TODAY'S TREATMENT:  12/15/23: *** Follow-up with her in approximately 2 and half weeks after vacation to check her status, check therapy putty activities, motion, strength etc.    Exercises educated on/reviewed today: - Turn Air traffic controller Up & Down  - 4 x daily - 15 reps - Bend and Pull Back Wrist SLOWLY  - 4 x daily - 15 reps - Tendon Glides  - 4 x daily - 5 reps - 3 second hold - Median Nerve Flossing  - 3 x daily - 5 reps - Wrist Prayer Stretch  - 4 x daily - 3 reps - 15 second hold - Wrist Flexion Stretch  - 4 x daily - 3-5 reps - 15 sec hold - Full finger stretches   - 4 x daily - 3 reps - 15 second hold - Full Fist  - 2-3 x daily - 5 reps - Finger Extension Pizza!   - 2-3 x daily - 5 reps - Thumb Opposition with Putty  - 2-3 x daily - 5 reps Patient Education - Scar Massage   PATIENT EDUCATION: Education details: See tx section above for details  Person educated: Patient Education method: Verbal Instruction, Teach back, Handouts  Education comprehension: States and demonstrates understanding, Additional Education required    HOME EXERCISE PROGRAM: Access Code: [email or web address] URL: [email or web address] Date: 12/01/2023 Prepared by: Melvenia Ada    GOALS: Goals reviewed with patient? Yes   SHORT TERM GOALS: (STG required if POC>30 days) Target Date: 11/21/23  Pt will obtain protective, custom orthotic. Goal status: N/A/discharge goal  2.  Pt will demo/state understanding of initial HEP to improve pain levels and prerequisite motion. Goal status: 12/01/2023: Met  LONG TERM GOALS: Target Date: 01/23/24  Pt will improve functional ability by decreased impairment per Quick DASH assessment from 64% to 25% or better, for better quality of life. Goal status: 12/08/23: INITIAL today   2.  Pt will improve grip strength in Rt hand from tender and contraindicated to at least 25 lbs for functional use  at home and in IADLs. Goal status: 12/08/23: 5.5# now  3.  Pt will improve A/ROM in right wrist flexion/extension from approximately 30 degrees each to at least approximately 55 degrees each, to have functional motion for tasks like reach and grasp.  Goal status: 12/08/23: improving   4.  Pt will improve strength in right wrist flexion/extension from apparent 3 -/5 MMT to at least 4+/5 MMT to have increased functional ability to carry out selfcare and higher-level homecare tasks with less difficulty. Goal status: 12/08/23: Not quite tolerated yet  5.  Pt will improve coordination skills in right hand and arm, as seen by within functional limit score on nine-hole peg testing to have increased functional ability to carry out fine motor tasks (fasteners, etc.) and more complex, coordinated IADLs (meal prep, sports, etc.).  Goal  status: 12/08/23: improving   6.  Pt will decrease pain at rest from 5.5/10 to 2/10 or better to have better sleep and occupational participation in daily roles. Goal status:  12/08/23: MET    ASSESSMENT:  CLINICAL IMPRESSION: 12/15/23: ***   12/08/23: She has now met her short-term goals and one of her long-term goals.  She still has some lingering hypersensitivity, fear, weakness, stiffness, though these things have been improving.  We were only able to meet 3 times over the past month in therapy in total due to some missed sessions.  OT feels that she would benefit from continued therapy services to help her reach her long-term goals.  She is in agreement and we will ask for additional authorization today.    PLAN:  OT FREQUENCY: 1-2x/week  OT DURATION: 6 additional weeks 12/12/23 - 01/23/24 and up to 9 total visits as needed   PLANNED INTERVENTIONS: 97535 self care/ADL training, 02889 therapeutic exercise, 97530 therapeutic activity, 97112 neuromuscular re-education, 97140 manual therapy, 97035 ultrasound, 97032 electrical stimulation (manual), 97760 Orthotic Initial, [id number]  Orthotic/Prosthetic subsequent, compression bandaging, Dry needling, energy conservation, coping strategies training, and patient/family education  CONSULTED AND AGREED WITH PLAN OF CARE: Patient  PLAN FOR NEXT SESSION:   ***  Melvenia Ada, OTR/L, CHT  11/13/23

## 2023-12-15 ENCOUNTER — Encounter: Admitting: Rehabilitative and Restorative Service Providers"

## 2023-12-15 ENCOUNTER — Telehealth: Payer: Self-pay | Admitting: Rehabilitative and Restorative Service Providers"

## 2023-12-15 NOTE — Telephone Encounter (Signed)
 OT called patient to discuss missed appointment. OT left message with pt about next appointment date/time.

## 2023-12-29 NOTE — Therapy (Signed)
 OUTPATIENT OCCUPATIONAL THERAPY TREATMENT NOTE  Patient Name: Amanda Villanueva MRN: [id number] DOB:29-Aug-1965, 58 y.o., female Today's Date: 12/30/2023  REFERRING PROVIDER: Arlinda Buster, MD     END OF SESSION:   Past Medical History:  Diagnosis Date   Asthma    Past Surgical History:  Procedure Laterality Date   ABDOMINAL HYSTERECTOMY     CARPAL TUNNEL RELEASE Right 10/31/2023   Procedure: CARPAL TUNNEL RELEASE;  Surgeon: Arlinda Buster, MD;  Location: [facility];  Service: Orthopedics;  Laterality: Right;  RIGHT OPEN CARPAL TUNNEL RELEASE   KNEE SURGERY     bilateral   TUBAL LIGATION     Patient Active Problem List   Diagnosis Date Noted   Lumbar radiculopathy 09/12/2022   Ganglion cyst of dorsum of right wrist 01/10/2022   Diverticulosis 09/21/2021   Hemangioma of intra-abdominal structure 09/21/2021   Asthma 07/25/2020   OA (osteoarthritis) of knee 09/30/2019   Degenerative disc disease at L5-S1 level 09/21/2019   Piriformis syndrome of left side 07/13/2019   Carpal tunnel syndrome, right upper limb 07/13/2019   Seasonal allergic conjunctivitis 11/30/2015   Moderate persistent asthma 10/18/2015   Seasonal and perennial allergic rhinitis 10/18/2015     ONSET DATE:  DOS 10/31/23  REFERRING DIAG: G56.01 (ICD-10-CM) - Carpal tunnel syndrome, right upper limb   THERAPY DIAG:     Localized edema  Muscle weakness (generalized)  Other lack of coordination  Paresthesia of skin  Pain in right hand  Rationale for Evaluation and Treatment: Rehabilitation  PERTINENT HISTORY:  The patient states hx of paresthesia and pain in their right hand and subsequent surgical release of the carpal tunnel. Now the patient states having some lingering paresthesia along with hypersensitivity, stiffness, pain, decreased ability to make a fist and perform I/ADLs.    PRECAUTIONS: None relative to this evaluation and episode of care.   RED FLAGS:  None   WEIGHT BEARING RESTRICTIONS: Yes: caution with weightbearing for the next 4-6 weeks, recommended less than 5lbs for next 2 weeks with affected hand   SUBJECTIVE:   SUBJECTIVE STATEMENT: She arrives a bit late today.  Patient is now approx 8+ weeks s/p Rt hand CTR.   She states having slightly less hypersensitivity now, doing a bit more around the home.  She also states that she will be taking a vacation in 2 weeks     PAIN:  Are you having pain? Yes: NPRS scale:  0/10 at rest right now  Pain location:  sx area Pain description: aching and sore Aggravating factors: gripping/squeezing Relieving factors: rest   PATIENT GOALS: To improve motion, function with affected surgical hand  NEXT MD VISIT: PRN    OBJECTIVE MEASURES:   12/08/23: Quick DASH: 64% impairment today   ADLs: Overall ADLs: States decreased ability to grab, hold household objects, pain and difficulty to open containers, perform FMS tasks (manipulate fasteners on clothing).     UPPER EXTREMITY ROM:     A/ROM Right eval Left eval Rt 12/01/23 Rt 12/08/23 Rt 12/30/23  Wrist flexion 31 70 50 35 59  Wrist extension 28 59 33 49 59  (Blank rows = not tested)                    Hand A/ROM Right eval Rt 12/01/23 Rt 12/08/23 Rt 12/30/23  Full Fist Ability (or Gap to Distal Palmar Crease) Unable due to stiffness/soreness 3.4cm gap from tip of MF to [facility]  3.0 cm gap Full fist, but tender  and slightly loose   Thumb Opposition  (Kapandji Scale)  3/10 4/10 6/10  8/10  (Blank rows = not tested)   HAND STRENGTH & FUNCTION: 12/15/23: Grip Rt: 18#    12/08/23: Grip Rt: 5.5#, Lt: 43.5#    COORDINATION: 12/08/23: 9HPT: Rt  32sec today (22sec  is WFL)     Eval: Moderate observed coordination impairments with surgical hand, as seen by pain,stiffness, etc. details will be tested when appropriate  SENSATION: Eval:  Light touch mildly diminished especially through sx area.  She also has some significant pain and  hypersensitivity around the scar and through the hypothenar eminence.   EDEMA:   Eval:  Mildly swollen in surgical hand today.  Expected to improve with HEP and recommendations.   OBSERVATIONS:   Eval: Surgical site is clean and no overt signs of infection, no drainage, signs of dehiscence, etc. Steri-Strips are clean and in place but she is very hypersensitive and tender to touch.  Rt CTR    TODAY'S TREATMENT:  12/30/23: She starts with active range of motion for exercise as well as new measures which shows improvements at the wrist and hand.  Next, she performs active range of motion within fluidotherapy machine for desensitization which she finds helpful.  OT then does manual therapy cupping for scar mobilization as well as manual therapy kinesiotaping to help add tension to her healing scars and support her thumb extension.  She states tolerating these well with some tenderness, understands to take off Kinesiotape in 2 or 3 days or sooner if it is causing any skin irritation.  OT also upgrades her home exercise program to include 2 pound wrist flexion curls to help start strengthening the wrist.  She tolerates these well and leaves in no significant increase in pain   Exercises - Bend and Pull Back Wrist SLOWLY  - 4 x daily - 15 reps - Tendon Glides  - 4 x daily - 5 reps - 3 second hold - Median Nerve Flossing  - 3 x daily - 5 reps - Wrist Prayer Stretch  - 4 x daily - 3 reps - 15 second hold - Wrist Flexion Stretch  - 4 x daily - 3-5 reps - 15 sec hold - Full finger stretches   - 4 x daily - 3 reps - 15 second hold - Full Fist  - 2-3 x daily - 5 reps - Finger Extension Pizza!   - 2-3 x daily - 5 reps - Thumb Opposition with Putty  - 2-3 x daily - 5 reps - Wrist Flexion with Dumbbell  - 4-6 x daily - 1 sets - 10-15 reps   PATIENT EDUCATION: Education details: See tx section above for details  Person educated: Patient Education method: Verbal Instruction, Teach back, Handouts   Education comprehension: States and demonstrates understanding, Additional Education required    HOME EXERCISE PROGRAM: Access Code: [email or web address] URL: [email or web address] Date: 12/01/2023 Prepared by: Melvenia Ada    GOALS: Goals reviewed with patient? Yes   SHORT TERM GOALS: (STG required if POC>30 days) Target Date: 11/21/23  Pt will obtain protective, custom orthotic. Goal status: N/A/discharge goal  2.  Pt will demo/state understanding of initial HEP to improve pain levels and prerequisite motion. Goal status: 12/01/2023: Met  LONG TERM GOALS: Target Date: 01/23/24  Pt will improve functional ability by decreased impairment per Quick DASH assessment from 64% to 25% or better, for better quality of life. Goal status: 12/08/23: INITIAL today   2.  Pt will  improve grip strength in Rt hand from tender and contraindicated to at least 25 lbs for functional use at home and in IADLs. Goal status: 12/08/23: 5.5# now  3.  Pt will improve A/ROM in right wrist flexion/extension from approximately 30 degrees each to at least approximately 55 degrees each, to have functional motion for tasks like reach and grasp.  Goal status: 12/08/23: improving   4.  Pt will improve strength in right wrist flexion/extension from apparent 3 -/5 MMT to at least 4+/5 MMT to have increased functional ability to carry out selfcare and higher-level homecare tasks with less difficulty. Goal status: 12/08/23: Not quite tolerated yet  5.  Pt will improve coordination skills in right hand and arm, as seen by within functional limit score on nine-hole peg testing to have increased functional ability to carry out fine motor tasks (fasteners, etc.) and more complex, coordinated IADLs (meal prep, sports, etc.).  Goal status: 12/08/23: improving   6.  Pt will decrease pain at rest from 5.5/10 to 2/10 or better to have better sleep and occupational participation in daily roles. Goal status:  12/08/23: MET     ASSESSMENT:  CLINICAL IMPRESSION: 12/30/23: She is doing better, the nerves take a long time to heal.  She is tolerating more, is much stronger, is moving better, but still having some hypersensitivity which will take time and continued desensitization   12/08/23: She has now met her short-term goals and one of her long-term goals.  She still has some lingering hypersensitivity, fear, weakness, stiffness, though these things have been improving.  We were only able to meet 3 times over the past month in therapy in total due to some missed sessions.  OT feels that she would benefit from continued therapy services to help her reach her long-term goals.  She is in agreement and we will ask for additional authorization today.    PLAN:  OT FREQUENCY: 1-2x/week  OT DURATION: 6 additional weeks 12/12/23 - 01/23/24 and up to 9 total visits as needed   PLANNED INTERVENTIONS: 97535 self care/ADL training, 02889 therapeutic exercise, 97530 therapeutic activity, 97112 neuromuscular re-education, 97140 manual therapy, 97035 ultrasound, 97032 electrical stimulation (manual), 97760 Orthotic Initial, [id number] Orthotic/Prosthetic subsequent, compression bandaging, Dry needling, energy conservation, coping strategies training, and patient/family education  CONSULTED AND AGREED WITH PLAN OF CARE: Patient  PLAN FOR NEXT SESSION:   Continue to desensitize, check K tape, check cupping, give program to last over the upcoming vacation  Melvenia Ada, OTR/L, CHT  11/13/23

## 2023-12-30 ENCOUNTER — Encounter: Payer: Self-pay | Admitting: Rehabilitative and Restorative Service Providers"

## 2023-12-30 ENCOUNTER — Ambulatory Visit (INDEPENDENT_AMBULATORY_CARE_PROVIDER_SITE_OTHER): Admitting: Rehabilitative and Restorative Service Providers"

## 2023-12-30 DIAGNOSIS — M6281 Muscle weakness (generalized): Secondary | ICD-10-CM | POA: Diagnosis not present

## 2023-12-30 DIAGNOSIS — R202 Paresthesia of skin: Secondary | ICD-10-CM

## 2023-12-30 DIAGNOSIS — R600 Localized edema: Secondary | ICD-10-CM | POA: Diagnosis not present

## 2023-12-30 DIAGNOSIS — M79641 Pain in right hand: Secondary | ICD-10-CM

## 2023-12-30 DIAGNOSIS — R278 Other lack of coordination: Secondary | ICD-10-CM

## 2024-01-01 NOTE — Therapy (Signed)
 OUTPATIENT OCCUPATIONAL THERAPY TREATMENT NOTE  Patient Name: Amanda Villanueva MRN: [id number] DOB:07-01-1965, 58 y.o., female Today's Date: 01/06/2024  REFERRING PROVIDER: Arlinda Buster, MD     END OF SESSION:   Past Medical History:  Diagnosis Date   Asthma    Past Surgical History:  Procedure Laterality Date   ABDOMINAL HYSTERECTOMY     CARPAL TUNNEL RELEASE Right 10/31/2023   Procedure: CARPAL TUNNEL RELEASE;  Surgeon: Arlinda Buster, MD;  Location: [facility];  Service: Orthopedics;  Laterality: Right;  RIGHT OPEN CARPAL TUNNEL RELEASE   KNEE SURGERY     bilateral   TUBAL LIGATION     Patient Active Problem List   Diagnosis Date Noted   Lumbar radiculopathy 09/12/2022   Ganglion cyst of dorsum of right wrist 01/10/2022   Diverticulosis 09/21/2021   Hemangioma of intra-abdominal structure 09/21/2021   Asthma 07/25/2020   OA (osteoarthritis) of knee 09/30/2019   Degenerative disc disease at L5-S1 level 09/21/2019   Piriformis syndrome of left side 07/13/2019   Carpal tunnel syndrome, right upper limb 07/13/2019   Seasonal allergic conjunctivitis 11/30/2015   Moderate persistent asthma 10/18/2015   Seasonal and perennial allergic rhinitis 10/18/2015     ONSET DATE:  DOS 10/31/23  REFERRING DIAG: G56.01 (ICD-10-CM) - Carpal tunnel syndrome, right upper limb   THERAPY DIAG:     Localized edema  Muscle weakness (generalized)  Other lack of coordination  Paresthesia of skin  Pain in right hand  Rationale for Evaluation and Treatment: Rehabilitation  PERTINENT HISTORY:  The patient states hx of paresthesia and pain in their right hand and subsequent surgical release of the carpal tunnel. Now the patient states having some lingering paresthesia along with hypersensitivity, stiffness, pain, decreased ability to make a fist and perform I/ADLs.    PRECAUTIONS: None relative to this evaluation and episode of care.   RED FLAGS:  None   WEIGHT BEARING RESTRICTIONS: Yes: caution with weightbearing for the next 1-2 weeks, recommended less than 5lbs for next 2 weeks with affected hand   SUBJECTIVE:   SUBJECTIVE STATEMENT: She states no resting pain today, I am only getting a lot of pain if I am using my arm and hand a lot now.     PAIN:  Are you having pain? Yes: NPRS scale:  0/10 at rest right now  Pain location:  sx area Pain description: aching and sore Aggravating factors: gripping/squeezing Relieving factors: rest   PATIENT GOALS: To improve motion, function with affected surgical hand  NEXT MD VISIT: PRN    OBJECTIVE MEASURES:   12/08/23: Quick DASH: 64% impairment today   ADLs: Overall ADLs: States decreased ability to grab, hold household objects, pain and difficulty to open containers, perform FMS tasks (manipulate fasteners on clothing).     UPPER EXTREMITY ROM:     A/ROM Right eval Left eval Rt 12/01/23 Rt 12/08/23 Rt 12/30/23  Wrist flexion 31 70 50 35 59  Wrist extension 28 59 33 49 59  (Blank rows = not tested)                    Hand A/ROM Right eval Rt 12/01/23 Rt 12/08/23 Rt 12/30/23  Full Fist Ability (or Gap to Distal Palmar Crease) Unable due to stiffness/soreness 3.4cm gap from tip of MF to [facility]  3.0 cm gap Full fist, but tender and slightly loose   Thumb Opposition  (Kapandji Scale)  3/10 4/10 6/10  8/10  (Blank rows = not tested)  HAND STRENGTH & FUNCTION: 01/06/24: Grip Rt: 28#   12/15/23: Grip Rt: 18#    12/08/23: Grip Rt: 5.5#, Lt: 43.5#    COORDINATION: 12/08/23: 9HPT: Rt  32sec today (22sec  is WFL)     Eval: Moderate observed coordination impairments with surgical hand, as seen by pain,stiffness, etc. details will be tested when appropriate  SENSATION: Eval:  Light touch mildly diminished especially through sx area.  She also has some significant pain and hypersensitivity around the scar and through the hypothenar eminence.   EDEMA:   Eval:  Mildly  swollen in surgical hand today.  Expected to improve with HEP and recommendations.   OBSERVATIONS:   Eval: Surgical site is clean and no overt signs of infection, no drainage, signs of dehiscence, etc. Steri-Strips are clean and in place but she is very hypersensitive and tender to touch.  Rt CTR    TODAY'S TREATMENT:  01/06/24: She tests her grip strength today which is significantly improved over the last session.  Next, as she tolerated cupping well, we perform this again directly over her scar.  She states getting a benefit from Kinesiotape in the past sessions so OT also spends time kinesiotaping again to help with her hand posture.  OT then upgraded her strengthening with red therapy band to include biceps and triceps today as bolded below.  She is going on vacation so she contact the red therapy band if she chooses into her suitcase and use it to continue to work on her wrist and her arm.  She was told to try to not lift anything too heavy or stressful while on vacation, but also anticipate things touching her and resistance as this will occur.  She should stay warmed up and stay desensitized to help meet this anticipated problem.       Exercises - Bend and Pull Back Wrist SLOWLY  - 4 x daily - 15 reps - Tendon Glides  - 4 x daily - 5 reps - 3 second hold - Median Nerve Flossing  - 3 x daily - 5 reps - Wrist Prayer Stretch  - 4 x daily - 3 reps - 15 second hold - Wrist Flexion Stretch  - 4 x daily - 3-5 reps - 15 sec hold - Full finger stretches   - 4 x daily - 3 reps - 15 second hold - Full Fist  - 2-3 x daily - 5 reps - Finger Extension Pizza!   - 2-3 x daily - 5 reps - Thumb Opposition with Putty  - 2-3 x daily - 5 reps - Wrist Flexion with Dumbbell  - 4-6 x daily - 1 sets - 10-15 reps - Wrist Flexion with Resistance  - 2-4 x daily - 1-2 sets - 10-15 reps - Wrist Extension with Resistance  - 2-4 x daily - 1-2 sets - 10-15 reps - Standing Bicep Curls with Resistance  - 2-4 x  daily - 1-2 sets - 10-15 reps - Standing Elbow Extension with Self-Anchored Resistance  - 4-6 x daily - 1-2 sets - 10-15 reps        12/30/23: She starts with active range of motion for exercise as well as new measures which shows improvements at the wrist and hand.  Next, she performs active range of motion within fluidotherapy machine for desensitization which she finds helpful.  OT then does manual therapy cupping for scar mobilization as well as manual therapy kinesiotaping to help add tension to her healing scars and support her  thumb extension.  She states tolerating these well with some tenderness, understands to take off Kinesiotape in 2 or 3 days or sooner if it is causing any skin irritation.  OT also upgrades her home exercise program to include 2 pound wrist flexion curls to help start strengthening the wrist.  She tolerates these well and leaves in no significant increase in pain   Exercises - Bend and Pull Back Wrist SLOWLY  - 4 x daily - 15 reps - Tendon Glides  - 4 x daily - 5 reps - 3 second hold - Median Nerve Flossing  - 3 x daily - 5 reps - Wrist Prayer Stretch  - 4 x daily - 3 reps - 15 second hold - Wrist Flexion Stretch  - 4 x daily - 3-5 reps - 15 sec hold - Full finger stretches   - 4 x daily - 3 reps - 15 second hold - Full Fist  - 2-3 x daily - 5 reps - Finger Extension Pizza!   - 2-3 x daily - 5 reps - Thumb Opposition with Putty  - 2-3 x daily - 5 reps - Wrist Flexion with Dumbbell  - 4-6 x daily - 1 sets - 10-15 reps   PATIENT EDUCATION: Education details: See tx section above for details  Person educated: Patient Education method: Verbal Instruction, Teach back, Handouts  Education comprehension: States and demonstrates understanding, Additional Education required    HOME EXERCISE PROGRAM: Access Code: [email or web address] URL: [email or web address] Date: 12/01/2023 Prepared by: Melvenia Ada    GOALS: Goals reviewed with patient?  Yes   SHORT TERM GOALS: (STG required if POC>30 days) Target Date: 11/21/23  Pt will obtain protective, custom orthotic. Goal status: N/A/discharge goal  2.  Pt will demo/state understanding of initial HEP to improve pain levels and prerequisite motion. Goal status: 12/01/2023: Met  LONG TERM GOALS: Target Date: 01/23/24  Pt will improve functional ability by decreased impairment per Quick DASH assessment from 64% to 25% or better, for better quality of life. Goal status: 12/08/23: INITIAL today   2.  Pt will improve grip strength in Rt hand from tender and contraindicated to at least 25 lbs for functional use at home and in IADLs. Goal status: 12/08/23: 5.5# now  3.  Pt will improve A/ROM in right wrist flexion/extension from approximately 30 degrees each to at least approximately 55 degrees each, to have functional motion for tasks like reach and grasp.  Goal status: 12/08/23: improving   4.  Pt will improve strength in right wrist flexion/extension from apparent 3 -/5 MMT to at least 4+/5 MMT to have increased functional ability to carry out selfcare and higher-level homecare tasks with less difficulty. Goal status: 12/08/23: Not quite tolerated yet  5.  Pt will improve coordination skills in right hand and arm, as seen by within functional limit score on nine-hole peg testing to have increased functional ability to carry out fine motor tasks (fasteners, etc.) and more complex, coordinated IADLs (meal prep, sports, etc.).  Goal status: 12/08/23: improving   6.  Pt will decrease pain at rest from 5.5/10 to 2/10 or better to have better sleep and occupational participation in daily roles. Goal status:  12/08/23: MET    ASSESSMENT:  CLINICAL IMPRESSION: 01/06/24: She is doing well and has better grip strength, no resting pain, states I am only really getting painful and sensitive with a lot of use now we need to continue to build endurance   12/30/23: She is doing better,  the nerves take a  long time to heal.  She is tolerating more, is much stronger, is moving better, but still having some hypersensitivity which will take time and continued desensitization   12/08/23: She has now met her short-term goals and one of her long-term goals.  She still has some lingering hypersensitivity, fear, weakness, stiffness, though these things have been improving.  We were only able to meet 3 times over the past month in therapy in total due to some missed sessions.  OT feels that she would benefit from continued therapy services to help her reach her long-term goals.  She is in agreement and we will ask for additional authorization today.    PLAN:  OT FREQUENCY: 1-2x/week  OT DURATION: 6 additional weeks 12/12/23 - 01/23/24 and up to 9 total visits as needed   PLANNED INTERVENTIONS: 97535 self care/ADL training, 02889 therapeutic exercise, 97530 therapeutic activity, 97112 neuromuscular re-education, 97140 manual therapy, 97035 ultrasound, 97032 electrical stimulation (manual), 97760 Orthotic Initial, 97763 Orthotic/Prosthetic subsequent, compression bandaging, Dry needling, energy conservation, coping strategies training, and patient/family education  CONSULTED AND AGREED WITH PLAN OF CARE: Patient  PLAN FOR NEXT SESSION:   Check with her after vacation, she may be appropriate for discharge soon if she makes it through vacation with little to no issues otherwise continue working on functional endurance and desensitization   Melvenia Ada, OTR/L, CHT  11/13/23

## 2024-01-06 ENCOUNTER — Encounter: Payer: Self-pay | Admitting: Rehabilitative and Restorative Service Providers"

## 2024-01-06 ENCOUNTER — Ambulatory Visit (INDEPENDENT_AMBULATORY_CARE_PROVIDER_SITE_OTHER): Admitting: Rehabilitative and Restorative Service Providers"

## 2024-01-06 DIAGNOSIS — R278 Other lack of coordination: Secondary | ICD-10-CM

## 2024-01-06 DIAGNOSIS — M79641 Pain in right hand: Secondary | ICD-10-CM

## 2024-01-06 DIAGNOSIS — R600 Localized edema: Secondary | ICD-10-CM | POA: Diagnosis not present

## 2024-01-06 DIAGNOSIS — R202 Paresthesia of skin: Secondary | ICD-10-CM | POA: Diagnosis not present

## 2024-01-06 DIAGNOSIS — M6281 Muscle weakness (generalized): Secondary | ICD-10-CM | POA: Diagnosis not present

## 2024-01-13 ENCOUNTER — Encounter: Admitting: Rehabilitative and Restorative Service Providers"

## 2024-01-19 NOTE — Therapy (Signed)
 OUTPATIENT OCCUPATIONAL THERAPY TREATMENT NOTE  Patient Name: Amanda Villanueva MRN: [id number] DOB:Nov 26, 1965, 58 y.o., female Today's Date: 01/19/2024  REFERRING PROVIDER: Arlinda Buster, MD     END OF SESSION:   Past Medical History:  Diagnosis Date   Asthma    Past Surgical History:  Procedure Laterality Date   ABDOMINAL HYSTERECTOMY     CARPAL TUNNEL RELEASE Right 10/31/2023   Procedure: CARPAL TUNNEL RELEASE;  Surgeon: Arlinda Buster, MD;  Location: [facility];  Service: Orthopedics;  Laterality: Right;  RIGHT OPEN CARPAL TUNNEL RELEASE   KNEE SURGERY     bilateral   TUBAL LIGATION     Patient Active Problem List   Diagnosis Date Noted   Lumbar radiculopathy 09/12/2022   Ganglion cyst of dorsum of right wrist 01/10/2022   Diverticulosis 09/21/2021   Hemangioma of intra-abdominal structure 09/21/2021   Asthma 07/25/2020   OA (osteoarthritis) of knee 09/30/2019   Degenerative disc disease at L5-S1 level 09/21/2019   Piriformis syndrome of left side 07/13/2019   Carpal tunnel syndrome, right upper limb 07/13/2019   Seasonal allergic conjunctivitis 11/30/2015   Moderate persistent asthma 10/18/2015   Seasonal and perennial allergic rhinitis 10/18/2015     ONSET DATE:  DOS 10/31/23  REFERRING DIAG: G56.01 (ICD-10-CM) - Carpal tunnel syndrome, right upper limb   THERAPY DIAG:     No diagnosis found.  Rationale for Evaluation and Treatment: Rehabilitation  PERTINENT HISTORY:  The patient states hx of paresthesia and pain in their right hand and subsequent surgical release of the carpal tunnel. Now the patient states having some lingering paresthesia along with hypersensitivity, stiffness, pain, decreased ability to make a fist and perform I/ADLs.    PRECAUTIONS: None relative to this evaluation and episode of care.   RED FLAGS: None   WEIGHT BEARING RESTRICTIONS: Yes: caution with weightbearing for the next 1-2 weeks, recommended less than  5lbs for next 2 weeks with affected hand   SUBJECTIVE:   SUBJECTIVE STATEMENT: She states ***    no resting pain today, I am only getting a lot of pain if I am using my arm and hand a lot now.     PAIN:  Are you having pain? Yes: NPRS scale: ***  0/10 at rest right now  Pain location:  sx area Pain description: aching and sore Aggravating factors: gripping/squeezing Relieving factors: rest   PATIENT GOALS: To improve motion, function with affected surgical hand  NEXT MD VISIT: PRN    OBJECTIVE MEASURES:   01/19/14: Quick DASH: ***%    12/08/23: Quick DASH: 64% impairment today   ADLs: Overall ADLs: States decreased ability to grab, hold household objects, pain and difficulty to open containers, perform FMS tasks (manipulate fasteners on clothing).     UPPER EXTREMITY ROM:     A/ROM Right eval Left eval Rt 12/01/23 Rt 12/08/23 Rt 12/30/23 Rt 01/20/24  Wrist flexion 31 70 50 35 59 ***  Wrist extension 28 59 33 49 59 ***  (Blank rows = not tested)                    Hand A/ROM Right eval Rt 12/01/23 Rt 12/08/23 Rt 12/30/23  Full Fist Ability (or Gap to Distal Palmar Crease) Unable due to stiffness/soreness 3.4cm gap from tip of MF to [facility]  3.0 cm gap Full fist, but tender and slightly loose   Thumb Opposition  (Kapandji Scale)  3/10 4/10 6/10  8/10  (Blank rows = not tested)  HAND STRENGTH & FUNCTION: 01/20/24: Grip Rt: ***#    01/06/24: Grip Rt: 28#   12/15/23: Grip Rt: 18#    12/08/23: Grip Rt: 5.5#, Lt: 43.5#    COORDINATION: 12/08/23: 9HPT: Rt  32sec today (22sec  is WFL)     Eval: Moderate observed coordination impairments with surgical hand, as seen by pain,stiffness, etc. details will be tested when appropriate  SENSATION: Eval:  Light touch mildly diminished especially through sx area.  She also has some significant pain and hypersensitivity around the scar and through the hypothenar eminence.   EDEMA:   Eval:  Mildly swollen in surgical hand  today.  Expected to improve with HEP and recommendations.   OBSERVATIONS:   Eval: Surgical site is clean and no overt signs of infection, no drainage, signs of dehiscence, etc. Steri-Strips are clean and in place but she is very hypersensitive and tender to touch.  Rt CTR    TODAY'S TREATMENT:  01/20/24: *** Check with her after vacation, she may be appropriate for discharge soon if she makes it through vacation with little to no issues otherwise continue working on functional endurance and desensitization   01/06/24: She tests her grip strength today which is significantly improved over the last session.  Next, as she tolerated cupping well, we perform this again directly over her scar.  She states getting a benefit from Kinesiotape in the past sessions so OT also spends time kinesiotaping again to help with her hand posture.  OT then upgraded her strengthening with red therapy band to include biceps and triceps today as bolded below.  She is going on vacation so she contact the red therapy band if she chooses into her suitcase and use it to continue to work on her wrist and her arm.  She was told to try to not lift anything too heavy or stressful while on vacation, but also anticipate things touching her and resistance as this will occur.  She should stay warmed up and stay desensitized to help meet this anticipated problem.       Exercises - Bend and Pull Back Wrist SLOWLY  - 4 x daily - 15 reps - Tendon Glides  - 4 x daily - 5 reps - 3 second hold - Median Nerve Flossing  - 3 x daily - 5 reps - Wrist Prayer Stretch  - 4 x daily - 3 reps - 15 second hold - Wrist Flexion Stretch  - 4 x daily - 3-5 reps - 15 sec hold - Full finger stretches   - 4 x daily - 3 reps - 15 second hold - Full Fist  - 2-3 x daily - 5 reps - Finger Extension Pizza!   - 2-3 x daily - 5 reps - Thumb Opposition with Putty  - 2-3 x daily - 5 reps - Wrist Flexion with Dumbbell  - 4-6 x daily - 1 sets - 10-15 reps -  Wrist Flexion with Resistance  - 2-4 x daily - 1-2 sets - 10-15 reps - Wrist Extension with Resistance  - 2-4 x daily - 1-2 sets - 10-15 reps - Standing Bicep Curls with Resistance  - 2-4 x daily - 1-2 sets - 10-15 reps - Standing Elbow Extension with Self-Anchored Resistance  - 4-6 x daily - 1-2 sets - 10-15 reps     PATIENT EDUCATION: Education details: See tx section above for details  Person educated: Patient Education method: Verbal Instruction, Teach back, Handouts  Education comprehension: States and demonstrates understanding,  Additional Education required    HOME EXERCISE PROGRAM: Access Code: [email or web address] URL: [email or web address] Date: 12/01/2023 Prepared by: Melvenia Ada    GOALS: Goals reviewed with patient? Yes   SHORT TERM GOALS: (STG required if POC>30 days) Target Date: 11/21/23  Pt will obtain protective, custom orthotic. Goal status: N/A/discharge goal  2.  Pt will demo/state understanding of initial HEP to improve pain levels and prerequisite motion. Goal status: 12/01/2023: Met  LONG TERM GOALS: Target Date: 01/23/24  Pt will improve functional ability by decreased impairment per Quick DASH assessment from 64% to 25% or better, for better quality of life. Goal status: 12/08/23: INITIAL today   2.  Pt will improve grip strength in Rt hand from tender and contraindicated to at least 25 lbs for functional use at home and in IADLs. Goal status: 12/08/23: 5.5# now  3.  Pt will improve A/ROM in right wrist flexion/extension from approximately 30 degrees each to at least approximately 55 degrees each, to have functional motion for tasks like reach and grasp.  Goal status: 12/08/23: improving   4.  Pt will improve strength in right wrist flexion/extension from apparent 3 -/5 MMT to at least 4+/5 MMT to have increased functional ability to carry out selfcare and higher-level homecare tasks with less difficulty. Goal status: 12/08/23: Not quite tolerated  yet  5.  Pt will improve coordination skills in right hand and arm, as seen by within functional limit score on nine-hole peg testing to have increased functional ability to carry out fine motor tasks (fasteners, etc.) and more complex, coordinated IADLs (meal prep, sports, etc.).  Goal status: 12/08/23: improving   6.  Pt will decrease pain at rest from 5.5/10 to 2/10 or better to have better sleep and occupational participation in daily roles. Goal status:  12/08/23: MET    ASSESSMENT:  CLINICAL IMPRESSION: 01/20/24: ***  01/06/24: She is doing well and has better grip strength, no resting pain, states I am only really getting painful and sensitive with a lot of use now we need to continue to build endurance   12/30/23: She is doing better, the nerves take a long time to heal.  She is tolerating more, is much stronger, is moving better, but still having some hypersensitivity which will take time and continued desensitization   12/08/23: She has now met her short-term goals and one of her long-term goals.  She still has some lingering hypersensitivity, fear, weakness, stiffness, though these things have been improving.  We were only able to meet 3 times over the past month in therapy in total due to some missed sessions.  OT feels that she would benefit from continued therapy services to help her reach her long-term goals.  She is in agreement and we will ask for additional authorization today.    PLAN:  OT FREQUENCY: 1-2x/week  OT DURATION: 6 additional weeks 12/12/23 - 01/23/24 and up to 9 total visits as needed   PLANNED INTERVENTIONS: 97535 self care/ADL training, 02889 therapeutic exercise, 97530 therapeutic activity, 97112 neuromuscular re-education, 97140 manual therapy, 97035 ultrasound, 97032 electrical stimulation (manual), 97760 Orthotic Initial, [id number] Orthotic/Prosthetic subsequent, compression bandaging, Dry needling, energy conservation, coping strategies training, and  patient/family education  CONSULTED AND AGREED WITH PLAN OF CARE: Patient  PLAN FOR NEXT SESSION:   ***  Melvenia Ada, OTR/L, CHT  11/13/23

## 2024-01-20 ENCOUNTER — Encounter: Payer: Self-pay | Admitting: Rehabilitative and Restorative Service Providers"

## 2024-01-20 ENCOUNTER — Ambulatory Visit (INDEPENDENT_AMBULATORY_CARE_PROVIDER_SITE_OTHER): Admitting: Orthopedic Surgery

## 2024-01-20 ENCOUNTER — Ambulatory Visit (INDEPENDENT_AMBULATORY_CARE_PROVIDER_SITE_OTHER): Admitting: Rehabilitative and Restorative Service Providers"

## 2024-01-20 DIAGNOSIS — M79641 Pain in right hand: Secondary | ICD-10-CM

## 2024-01-20 DIAGNOSIS — R600 Localized edema: Secondary | ICD-10-CM | POA: Diagnosis not present

## 2024-01-20 DIAGNOSIS — M6281 Muscle weakness (generalized): Secondary | ICD-10-CM | POA: Diagnosis not present

## 2024-01-20 DIAGNOSIS — R202 Paresthesia of skin: Secondary | ICD-10-CM

## 2024-01-20 DIAGNOSIS — R278 Other lack of coordination: Secondary | ICD-10-CM | POA: Diagnosis not present

## 2024-01-20 DIAGNOSIS — Z9889 Other specified postprocedural states: Secondary | ICD-10-CM

## 2024-01-20 NOTE — Progress Notes (Unsigned)
   Amanda Villanueva - 58 y.o. female MRN [id number]  Date of birth: 1966/01/12  Office Visit Note: Visit Date: 01/20/2024 PCP: Rosalea Rosina SAILOR, PA Referred by: Rosalea Rosina SAILOR, PA  Subjective:  HPI: Amanda Villanueva is a 58 y.o. female who presents today for follow up Right CTR status post 10/31/23. Burning and sore.  Pertinent ROS were reviewed with the patient and found to be negative unless otherwise specified above in HPI.   Assessment & Plan: Visit Diagnoses: No diagnosis found.  Plan: ***  Follow-up: No follow-ups on file.   Meds & Orders: No orders of the defined types were placed in this encounter.  No orders of the defined types were placed in this encounter.    Procedures: 10/31/23 Right CTR      Objective:   Vital Signs: LMP 05/11/2018   Ortho Exam ***  Imaging: No results found.   Jarman Litton Afton Alderton, M.D. [facility], Hand Surgery

## 2024-02-10 ENCOUNTER — Ambulatory Visit: Admitting: Orthopedic Surgery

## 2024-02-10 DIAGNOSIS — Z9889 Other specified postprocedural states: Secondary | ICD-10-CM | POA: Diagnosis not present

## 2024-02-10 NOTE — Progress Notes (Signed)
   Amanda Villanueva - 58 y.o. female MRN [id number]  Date of birth: [birth year]  Office Visit Note: Visit Date: 02/10/2024 PCP: Rosalea Rosina SAILOR, PA Referred by: Rosalea Rosina SAILOR, PA  Subjective:  HPI: Amanda Villanueva is a 58 y.o. female who presents today for follow up 14 weeks status post right wrist open carpal tunnel release.  Making steady progress, has resumed activities as tolerated.  Still has some ongoing soreness throughout the wrist and hand region.  Numbness and tingling has improved significantly.  Pertinent ROS were reviewed with the patient and found to be negative unless otherwise specified above in HPI.   Assessment & Plan: Visit Diagnoses:  1. S/P carpal tunnel release     Plan: She continues to do well postoperatively.  I did explain that she has an element of pillar pain that is gradually improving with time.  She can resume activity as tolerated without restriction at this juncture.  I recommended that she follow-up with me in approximate 3 months to track her progress from a nerve healing standpoint   Follow-up: No follow-ups on file.   Meds & Orders: No orders of the defined types were placed in this encounter.  No orders of the defined types were placed in this encounter.    Procedures: No procedures performed       Objective:   Vital Signs: LMP 05/11/2018     Ortho Exam Left hand: - Well-healed palmar incision - Composite fist without restriction - Sensation intact to light touch in median nerve distribution - 5/5 APB no thenar atrophy   Imaging: No results found.   Edrees Valent Afton Alderton, M.D. [facility], Hand Surgery

## 2024-02-12 ENCOUNTER — Telehealth: Payer: Self-pay | Admitting: Orthopedic Surgery

## 2024-02-12 NOTE — Telephone Encounter (Signed)
 Received call from patient. Need to update [name] form to RTW 10/13 and to fax OT notes with form. I updated form and faxed with OT notes [phone]

## 2024-02-16 ENCOUNTER — Encounter: Payer: Self-pay | Admitting: Orthopedic Surgery

## 2024-02-23 NOTE — Therapy (Incomplete)
 OUTPATIENT OCCUPATIONAL THERAPY TREATMENT & *** NOTE  Patient Name: Amanda Villanueva MRN: [id number] DOB:November 18, 1965, 58 y.o., female Today's Date: 02/23/2024  REFERRING PROVIDER: Arlinda Buster, MD       END OF SESSION:   Past Medical History:  Diagnosis Date   Asthma    Past Surgical History:  Procedure Laterality Date   ABDOMINAL HYSTERECTOMY     CARPAL TUNNEL RELEASE Right 10/31/2023   Procedure: CARPAL TUNNEL RELEASE;  Surgeon: Arlinda Buster, MD;  Location: [facility];  Service: Orthopedics;  Laterality: Right;  RIGHT OPEN CARPAL TUNNEL RELEASE   KNEE SURGERY     bilateral   TUBAL LIGATION     Patient Active Problem List   Diagnosis Date Noted   Lumbar radiculopathy 09/12/2022   Ganglion cyst of dorsum of right wrist 01/10/2022   Diverticulosis 09/21/2021   Hemangioma of intra-abdominal structure 09/21/2021   Asthma 07/25/2020   OA (osteoarthritis) of knee 09/30/2019   Degenerative disc disease at L5-S1 level 09/21/2019   Piriformis syndrome of left side 07/13/2019   Carpal tunnel syndrome, right upper limb 07/13/2019   Seasonal allergic conjunctivitis 11/30/2015   Moderate persistent asthma 10/18/2015   Seasonal and perennial allergic rhinitis 10/18/2015     ONSET DATE:  DOS 10/31/23  REFERRING DIAG: G56.01 (ICD-10-CM) - Carpal tunnel syndrome, right upper limb   THERAPY DIAG:     No diagnosis found.  Rationale for Evaluation and Treatment: Rehabilitation  PERTINENT HISTORY:  The patient states hx of paresthesia and pain in their right hand and subsequent surgical release of the carpal tunnel. Now the patient states having some lingering paresthesia along with hypersensitivity, stiffness, pain, decreased ability to make a fist and perform I/ADLs.     PRECAUTIONS: None relative to this evaluation and episode of care.   RED FLAGS: None   WEIGHT BEARING RESTRICTIONS: Yes: caution with weightbearing for the next 1-2 weeks,  recommended less than 5lbs for next 2 weeks with affected hand   SUBJECTIVE:   SUBJECTIVE STATEMENT: Now approx 3 months post-op.  She hasn't come to therapy in 5 weeks.  Today she states ***    she hit her scar on a cup while vacation which hurt a lot, other than that, she was taking it easy, relaxing and trying to get HEP done.     PAIN:  Are you having pain? Yes: NPRS scale:  *** 0/10 at rest right now - just a slight bit painful with activities Pain location:  sx area Pain description: aching and sore Aggravating factors: gripping/squeezing Relieving factors: rest   PATIENT GOALS: To improve motion, function with affected surgical hand  NEXT MD VISIT: PRN    OBJECTIVE MEASURES:   02/25/24: Quick DASH: ***%    01/19/14: Quick DASH: 43%  now  (improved)    12/08/23: Quick DASH: 64% impairment today   ADLs: Overall ADLs: States decreased ability to grab, hold household objects, pain and difficulty to open containers, perform FMS tasks (manipulate fasteners on clothing).     UPPER EXTREMITY ROM:     A/ROM Right eval Left eval Rt 12/01/23 Rt 12/08/23 Rt 12/30/23 Rt 01/20/24  Wrist flexion 31 70 50 35 59 60  Wrist extension 28 59 33 49 59 75  (Blank rows = not tested)                    Hand A/ROM Right eval Rt 12/01/23 Rt 12/08/23 Rt 12/30/23 Rt 01/20/24  Full Fist Ability (or Gap to  Distal Palmar Crease) Unable due to stiffness/soreness 3.4cm gap from tip of MF to [facility]  3.0 cm gap Full fist, but tender and slightly loose  Full fist  Thumb Opposition  (Kapandji Scale)  3/10 4/10 6/10  8/10 9/10  (Blank rows = not tested)   HAND STRENGTH & FUNCTION: 02/25/24: Grip Rt: ***#  MMT wrist flexion: ***/5 tender;  wrist extension: ***/5     01/20/24: Grip Rt: 29#  MMT wrist flexion: 4-/5 tender;  wrist extension: 4+/5    12/08/23: Grip Rt: 5.5#, Lt: 43.5#    COORDINATION: 01/20/24: 9HPT: Rt: 21sec  WNL now    12/08/23: 9HPT: Rt  32sec today (22sec  is WFL)      SENSATION: Eval:  Light touch mildly diminished especially through sx area.  She also has some significant pain and hypersensitivity around the scar and through the hypothenar eminence.   EDEMA:   Eval:  Mildly swollen in surgical hand today.  Expected to improve with HEP and recommendations.   OBSERVATIONS:   Eval: Surgical site is clean and no overt signs of infection, no drainage, signs of dehiscence, etc. Steri-Strips are clean and in place but she is very hypersensitive and tender to touch.  Rt CTR    TODAY'S TREATMENT:  02/25/24: *** Start with dynamic functional activities to boost functional endurance and strength and to limit timid,  learned behaviors that have developed from pain      01/20/24: Pt performs AROM, gripping, and strength with right hand/wrist/arm against therapist's resistance for exercise/activities as well as new measures today.  She does fine motor skill activity showing excellent fine motor skills now, but no significant changes over the past 2 weeks with grip or pinch strength.  OT also discusses home and functional tasks with the pt and reviews goals.  Since her last progress note, she has made significant progress and met for out of 6 of her long-term goals.  We did discuss the goals that she has not met yet, review her exercises and address how to meet these goals.  OT does feel that she has developed some habits due to pain, that we need to change to get her moving a bit better and strengthened up better.  In upcoming sessions, OT will plan to use dynamic functional activities to help improve these remaining deficits.  Using the complied data, OT also reviews home exercises and provides updated recommendations and upgrades as below (removed some exercises that were too easy, emphasize good form and reviewed newer strengthening techniques). Pt states understanding and tolerates upgrades well.     Exercises - Median Nerve Flossing  - 3 x daily - 5 reps -  Wrist Prayer Stretch  - 4 x daily - 3 reps - 15 second hold - Wrist Flexion Stretch  - 4 x daily - 3-5 reps - 15 sec hold - Full finger stretches   - 4 x daily - 3 reps - 15 second hold - Full Fist  - 2-3 x daily - 5 reps - Finger Extension Pizza!   - 2-3 x daily - 5 reps - Thumb Opposition with Putty  - 2-3 x daily - 5 reps - Wrist Flexion with Dumbbell  - 4-6 x daily - 1 sets - 10-15 reps - Wrist Flexion with Resistance  - 2-4 x daily - 1-2 sets - 10-15 reps - Wrist Extension with Resistance  - 2-4 x daily - 1-2 sets - 10-15 reps - Standing Bicep Curls with Resistance  -  2-4 x daily - 1-2 sets - 10-15 reps - Standing Elbow Extension with Self-Anchored Resistance  - 4-6 x daily - 1-2 sets - 10-15 reps   PATIENT EDUCATION: Education details: See tx section above for details  Person educated: Patient Education method: Verbal Instruction, Teach back, Handouts  Education comprehension: States and demonstrates understanding, Additional Education required    HOME EXERCISE PROGRAM: Access Code: [email or web address] URL: [email or web address] Date: 12/01/2023 Prepared by: Melvenia Ada    GOALS: Goals reviewed with patient? Yes   SHORT TERM GOALS: (STG required if POC>30 days) Target Date: 11/21/23  Pt will obtain protective, custom orthotic. Goal status: N/A/discharge goal  2.  Pt will demo/state understanding of initial HEP to improve pain levels and prerequisite motion. Goal status: 12/01/2023: Met  LONG TERM GOALS: Target Date: 02/20/24  Pt will improve functional ability by decreased impairment per Quick DASH assessment from 64% to 25% or better, for better quality of life. Goal status: 01/20/24: improved to 43% now   2.  Pt will improve grip strength in Rt hand from tender and contraindicated to at least 25 lbs for functional use at home and in IADLs. Goal status: 01/20/24: MET and will be upgraded to 40# now to be WNL   3.  Pt will improve A/ROM in right wrist  flexion/extension from approximately 30 degrees each to at least approximately 55 degrees each, to have functional motion for tasks like reach and grasp.  Goal status: 01/20/24: MET  4.  Pt will improve strength in right wrist flexion/extension from apparent 3 -/5 MMT to at least 4+/5 MMT to have increased functional ability to carry out selfcare and higher-level homecare tasks with less difficulty. Goal status: 01/20/24: improved    5.  Pt will improve coordination skills in right hand and arm, as seen by within functional limit score on nine-hole peg testing to have increased functional ability to carry out fine motor tasks (fasteners, etc.) and more complex, coordinated IADLs (meal prep, sports, etc.).  Goal status:01/20/24: MET   6.  Pt will decrease pain at rest from 5.5/10 to 2/10 or better to have better sleep and occupational participation in daily roles. Goal status:  12/08/23: MET    ASSESSMENT:  CLINICAL IMPRESSION: 02/25/24: ***  01/20/24: She has now met 4/6 long-term goals, but still has some weakness and pain, especially with lifting and weighted or repetitious activities.   We will continue therapy for up to 4 more weeks, at which time she will be expected to make a full recovery. She has been working o light arm/wrist strength and hand strength, and will will make this tougher and more dynamic in upcoming sessions.     PLAN:  OT FREQUENCY: 1-2x/week  OT DURATION:4  additional weeks from 01/20/24  - 02/20/24 and up to 10 total visits as needed   PLANNED INTERVENTIONS: 97535 self care/ADL training, 02889 therapeutic exercise, 97530 therapeutic activity, 97112 neuromuscular re-education, 97140 manual therapy, 97035 ultrasound, 97032 electrical stimulation (manual), 97760 Orthotic Initial, [id number] Orthotic/Prosthetic subsequent, compression bandaging, Dry needling, energy conservation, coping strategies training, and patient/family education  CONSULTED AND AGREED WITH PLAN OF CARE:  Patient  PLAN FOR NEXT SESSION:   ***  Melvenia Ada, OTR/L, CHT  11/13/23

## 2024-02-25 ENCOUNTER — Encounter: Admitting: Rehabilitative and Restorative Service Providers"

## 2024-03-05 ENCOUNTER — Telehealth: Payer: Self-pay

## 2024-03-05 NOTE — Telephone Encounter (Signed)
 No, they did not. That was all the information they gave.

## 2024-03-05 NOTE — Telephone Encounter (Signed)
 Dr. Rayleen would like a peer to peer for patient.  CB# [phone].  Please advise.  Thank You.

## 2024-03-08 ENCOUNTER — Encounter: Payer: Self-pay | Admitting: Radiology

## 2024-03-09 ENCOUNTER — Telehealth: Payer: Self-pay

## 2024-03-09 NOTE — Telephone Encounter (Signed)
 [phone] called requesting a Peer to Peer with Dr. Erwin regarding the pt's short term disability. When I called back to get more information the person on the other end of the line told me that the P2P is more medical necessity for the short term disability. When Dr. Erwin tried to call it went to voicemail.

## 2024-04-22 ENCOUNTER — Other Ambulatory Visit: Payer: Self-pay | Admitting: Orthopedic Surgery

## 2024-04-22 ENCOUNTER — Telehealth: Payer: Self-pay | Admitting: Orthopedic Surgery

## 2024-04-22 NOTE — Telephone Encounter (Signed)
 Received vm from patient. She is requesting a note that she was unable to work from 12/09/23 through September. She states she wasn't released to return to work before 02/17/24. Patients callback [phone]

## 2024-05-16 NOTE — Progress Notes (Unsigned)
" ° °  Amanda Villanueva - 59 y.o. female MRN [id number]  Date of birth: 1966/03/21  Office Visit Note: Visit Date: 05/17/2024 PCP: Rosalea Rosina SAILOR, PA Referred by: Rosalea Rosina SAILOR, PA  Subjective:  HPI: Amanda Villanueva is a 59 y.o. female who presents today for follow up 6 months status post right wrist open carpal tunnel release.  Pertinent ROS were reviewed with the patient and found to be negative unless otherwise specified above in HPI.   Assessment & Plan: Visit Diagnoses: No diagnosis found.  Plan: ***  Follow-up: No follow-ups on file.   Meds & Orders: No orders of the defined types were placed in this encounter.  No orders of the defined types were placed in this encounter.    Procedures: No procedures performed       Objective:   Vital Signs: LMP 05/11/2018   Ortho Exam ***  Imaging: No results found.   Deric Bocock Afton Alderton, M.D. [facility], Hand Surgery  "

## 2024-05-17 ENCOUNTER — Ambulatory Visit: Admitting: Orthopedic Surgery

## 2024-05-17 DIAGNOSIS — M67431 Ganglion, right wrist: Secondary | ICD-10-CM

## 2024-05-17 DIAGNOSIS — Z9889 Other specified postprocedural states: Secondary | ICD-10-CM
